# Patient Record
Sex: Male | Born: 1965
Health system: Southern US, Community
[De-identification: ages and names within clinical notes are randomized; demographics above are authoritative.]

## PROBLEM LIST (undated history)

## (undated) HISTORY — PX: ACHILLES TENDON REPAIR: SUR1153

## (undated) HISTORY — PX: KNEE ARTHROSCOPY: SUR90

---

## 1997-06-19 ENCOUNTER — Emergency Department (HOSPITAL_COMMUNITY): Admission: EM | Admit: 1997-06-19 | Discharge: 1997-06-19 | Payer: Self-pay | Admitting: Emergency Medicine

## 1999-04-15 ENCOUNTER — Ambulatory Visit (HOSPITAL_BASED_OUTPATIENT_CLINIC_OR_DEPARTMENT_OTHER): Admission: RE | Admit: 1999-04-15 | Discharge: 1999-04-16 | Payer: Self-pay | Admitting: Orthopedic Surgery

## 2000-05-08 ENCOUNTER — Ambulatory Visit (HOSPITAL_BASED_OUTPATIENT_CLINIC_OR_DEPARTMENT_OTHER): Admission: RE | Admit: 2000-05-08 | Discharge: 2000-05-09 | Payer: Self-pay | Admitting: Orthopedic Surgery

## 2002-03-25 ENCOUNTER — Ambulatory Visit (HOSPITAL_COMMUNITY): Admission: RE | Admit: 2002-03-25 | Discharge: 2002-03-25 | Payer: Self-pay | Admitting: Family Medicine

## 2002-03-25 ENCOUNTER — Encounter: Payer: Self-pay | Admitting: Family Medicine

## 2004-01-23 ENCOUNTER — Emergency Department (HOSPITAL_COMMUNITY): Admission: EM | Admit: 2004-01-23 | Discharge: 2004-01-23 | Payer: Self-pay | Admitting: Emergency Medicine

## 2004-08-26 ENCOUNTER — Emergency Department (HOSPITAL_COMMUNITY): Admission: EM | Admit: 2004-08-26 | Discharge: 2004-08-26 | Payer: Self-pay | Admitting: Emergency Medicine

## 2008-01-30 ENCOUNTER — Emergency Department (HOSPITAL_COMMUNITY): Admission: EM | Admit: 2008-01-30 | Discharge: 2008-01-30 | Payer: Self-pay | Admitting: Emergency Medicine

## 2008-03-03 ENCOUNTER — Emergency Department (HOSPITAL_COMMUNITY): Admission: EM | Admit: 2008-03-03 | Discharge: 2008-03-03 | Payer: Self-pay | Admitting: Emergency Medicine

## 2008-03-15 ENCOUNTER — Ambulatory Visit (HOSPITAL_COMMUNITY): Admission: RE | Admit: 2008-03-15 | Discharge: 2008-03-15 | Payer: Self-pay | Admitting: Neurosurgery

## 2008-04-10 ENCOUNTER — Ambulatory Visit (HOSPITAL_COMMUNITY): Admission: RE | Admit: 2008-04-10 | Discharge: 2008-04-11 | Payer: Self-pay | Admitting: Neurosurgery

## 2009-10-01 ENCOUNTER — Emergency Department (HOSPITAL_COMMUNITY): Admission: EM | Admit: 2009-10-01 | Discharge: 2009-10-01 | Payer: Self-pay | Admitting: Emergency Medicine

## 2009-12-07 ENCOUNTER — Encounter: Admission: RE | Admit: 2009-12-07 | Discharge: 2009-12-07 | Payer: Self-pay | Admitting: Orthopedic Surgery

## 2010-05-02 LAB — CBC
HCT: 45.6 % (ref 39.0–52.0)
Hemoglobin: 16 g/dL (ref 13.0–17.0)
MCHC: 35.1 g/dL (ref 30.0–36.0)
MCV: 93.9 fL (ref 78.0–100.0)
Platelets: 307 10*3/uL (ref 150–400)
RBC: 4.86 MIL/uL (ref 4.22–5.81)
RDW: 12.1 % (ref 11.5–15.5)
WBC: 6.2 10*3/uL (ref 4.0–10.5)

## 2010-05-02 LAB — BASIC METABOLIC PANEL
BUN: 11 mg/dL (ref 6–23)
CO2: 29 mEq/L (ref 19–32)
Calcium: 10.1 mg/dL (ref 8.4–10.5)
Chloride: 103 mEq/L (ref 96–112)
Creatinine, Ser: 0.92 mg/dL (ref 0.4–1.5)
GFR calc Af Amer: >60 mL/min (ref >60)
GFR calc non Af Amer: >60 mL/min (ref >60)
Glucose, Bld: 118 mg/dL — ABNORMAL HIGH (ref 70–99)
Potassium: 4.6 mEq/L (ref 3.5–5.1)
Sodium: 138 mEq/L (ref 135–145)

## 2010-06-04 NOTE — Op Note (Signed)
[name], [name]                 ACCOUNT NO.:  [id number]   MEDICAL RECORD NO.:  [id number]          PATIENT TYPE:  OIB   LOCATION:  3535                         FACILITY:  [facility]   PHYSICIAN:  Hewitt Shorts, M.D.DATE OF BIRTH:  06/28/1965   DATE OF PROCEDURE:  04/10/2008  DATE OF DISCHARGE:                               OPERATIVE REPORT   PREOPERATIVE DIAGNOSES:  1. Right L5-S1 lumbar disc herniation.  2. Lumbar degenerative disc disease.  3. Lumbar spondylosis.  4. Lumbar radiculopathy.   POSTOPERATIVE DIAGNOSES:  1. Right L5-S1 lumbar disc herniation.  2. Lumbar degenerative disc disease.  3. Lumbar spondylosis.  4. Lumbar radiculopathy.   PROCEDURE:  Right L5-S1 lumbar laminotomy and microdiskectomy with  microdissection.   SURGEON:  Hewitt Shorts, MD   ASSISTANT:  1. Nelia Shi. Webb Silversmith, RN  2. Payton Doughty, MD   ANESTHESIA:  General endotracheal.   INDICATIONS:  The patient is a 45 year old man who presented with right  lumbar radiculopathy.  He was found by MRI scan to have a large right L5-  S1 lumbar disc herniation superimposed upon underlying degenerative disk  disease and spondylosis.  Decision was made to proceed with elective  laminotomy and microdiscectomy.   PROCEDURE:  The patient was brought to the operating room and placed  under general endotracheal anesthesia.  The patient was turned to a  prone position.  Lumbar region was prepped with Betadine soap and  solution and draped in a sterile fashion.  The midline was infiltrated  with local anesthetic with epinephrine and then midline incision was  made over the L5-S1 level and x-ray was taken prior to incision to help  localize.  Dissection was carried down to the subcutaneous tissue to the  lumbar fascia, which was incised on the right side of the midline and  the paraspinal muscles were dissected from the spinous process and  lamina in a subperiosteal fashion.  The L5-S1 intralaminar space  was  identified and another x-ray was taken to confirm the localization and  then laminotomy was performed using the X'MaxDrill System.  The  microscope was draped and brought to the field to provide additional  magnification, illumination, and visualization.  The remainder of the  decompression was performed using microdissection and microsurgical  technique.  The edge of the laminotomy were waxed and then we carefully  removed the ligamentum flavum and identified the thecal sac and exiting  right S1 nerve root.  We began to dissect the nerve root, freeing it up  from surrounding epidural tissues.  The nerve root along with the thecal  sac was mobilized medially and the exposed disk herniation was quite  large and we were able to incise the remaining annular fibers and remove  significant fragment and additional small fragments were removed as  well.  Then, we entered into the disk space and we continued the  diskectomy by removing all loose fragments and disk material from both  the disk space and the epidural space and in the end, good decompression  of the thecal sac and nerve root was  achieved.  Bipolar cautery was used  to maintain hemostasis.  In the end, once diskectomy was completed and  good decompression was achieved, we irrigated the wound with Bacitracin  solution.  Hemostasis was established with bipolar cautery and then we  instilled 2 mL of fentanyl and 80 mg of Depo-Medrol into the epidural  space and proceeded with closure.  The deep fascia was closed with  interrupted undyed #1 Vicryl sutures.  The subcutaneous and subcuticular  were closed with interrupted inverted 2-0 and 3-0 undyed Vicryl sutures.  The skin was approximated with Dermabond.  The procedure was tolerated  well.  The estimated blood loss was less than 25 mL.  Sponge and needle  counts were correct.  Following surgery, the patient was returned back  to the supine position, to be reversed from the  anesthetic, extubated,  and transferred to the recovery room for further care.      Hewitt Shorts, M.D.  Electronically Signed     RWN/MEDQ  D:  04/10/2008  T:  04/11/2008  Job:  [id number]

## 2010-06-07 NOTE — Op Note (Signed)
[place]  Patient:    [name], [name]                          MRN: [id number] Proc. Date: 04/15/99 Adm. Date:  [id number] Disc. Date: [id number] Attending:  Milly Jakob CC:         Harvie Junior, M.D.                           Operative Report  PREOPERATIVE DIAGNOSIS:  Patellar tendon rupture.  POSTOPERATIVE DIAGNOSIS:  Patellar tendon rupture.  OPERATION PERFORMED:  Repair of right patellar tendon.  SURGEON:  Harvie Junior, M.D.  ASSISTANT:  Kerby Less, P.A.  ANESTHESIA:  General.  INDICATIONS FOR PROCEDURE:  The patient is a 45 year old male with a long history of having had a patellar tendon rupture about two weeks prior to evaluation. He felt a pop.  He was not able to move his leg, but he did not come to the doctor for two weeks and his initial evaluation was noted to have patella _______ on x-ray  and was noted to have inability to do a straight leg raise and a palpable gap at the junction of the patellar tendon with the bone.  At this point the patient was brought to the operating room for fixation this.  DESCRIPTION OF PROCEDURE:  The patient was brought to the operating room. After adequate anesthesia was obtained with general anesthetic, patient was placed supine on the operating table.  The right leg was prepped and draped in the usual sterile fashion.  Following Esmarch exsanguination of the extremity, a blood pressure tourniquet was inflated to 300 mmHg.  Following this, a midline incision was made over the anterior aspect of the patella.  The subcutaneous tissues were dissected down to the level of the patella and joint fluid was escaping into the wound at  this point.  The patellar tendon was identified and there was no connection of he patellar tendon to the patella.  Similarly the patellar retinaculum was torn both medially and laterally at fairly significant distance.  At this point the retinacular  tears were identified both medial and laterally.  The patellar tendon was identified.  There was a significant amount of scar tissue over the patellar tendon which had to be resected back to the level of the tendon.  The scar tissue over the inferior pole of the patella was identified and this was rongeured back to bleeding bone.  Four drill holes were then placed through the patella and parallel and following this three #5 Ethibond baseball sutures were passed through the patellar tendon.  These were advanced through the drill holes in the patella and tied with the knee in 45 degrees of flexion.  The patellar tendon was tied. There was no stress on the repair at that point.  Excellent fixation was achieved at his point with 45 degrees of flexion.  The three sutures were tied and following this, the knee joint was copiously irrigated and suctioned dry one final time.  The retinacular rents were closed with 0 Vicryl interrupted suture.  The deep layers were then closed with 0 Vicryl and 2-0 Vicryl and the skin with skin staples. he ____________ was closed prior to this closure.  The skin was then washed, dried  and a sterile compressive dressing was applied.  The patient was placed in a knee immobilizer.  He  will bee seen back in the office in about three or four days. DD:  04/15/99 TD:  04/16/99 Job: [id number]

## 2010-06-07 NOTE — Op Note (Signed)
[place]  Patient:    [name], [name]                          MRN: [id number] Proc. Date: 05/08/00 Adm. Date:  [id number] Attending:  Milly Jakob                           Operative Report  PREOPERATIVE DIAGNOSIS:  Patellar tendon rupture.  POSTOPERATIVE DIAGNOSIS:  Patellar tendon rupture.  PRINCIPAL PROCEDURE:  Repair of patellar tendon with medial side repaired to bone and lateral side repaired tendon-to-tendon.  SURGEON:  Harvie Junior, M.D.  ASSISTANT:  ______  ANESTHESIA:  General.  BRIEF HISTORY:  This is a 45 year old male with a long history of having a year ago a rupture of his patellar tendon that was repaired.  Ultimately, he went on to do well.  At this point, he had a similar injury and ruptured his patellar tendon.  He presented with inability to extend the knee.  We had a long talk about the treatment options and is also being taken to the operating room for repair of patellar tendon.  PROCEDURE:  The patient was taken to the operating room after adequate general anesthesia was obtained.  ______ The patient was placed on the operating table.  The left leg was then prepped and draped in the usual sterile fashion. Following Esmarch exsanguination of the extremity, the ______ was inflated to 300 mmHg.  Following this, a midline incision was made.  The subcutaneous tissue was taken down to the level of the patellar tendon.  The interesting findings at this point showed that the patellar tendon had ripped off of bone on the medial side.  On the lateral side, there were still fibers, which appeared to be stretched but went from the patella distally.  At this point, a prolonged period of time was undertaken trying to identify exactly where the fibers had come from.  It was a little bit unclear exactly the outline of the fiber pattern.  At this point, a central #5 Ethibond suture was used down the central portion of the tendon  in a baseball fashion.  A similar procedure was performed on the medial side of the tendon in the baseball fashion.  These were attached to bone with the knee at 45 degrees of flexion through drill holes after roughening up the bone surface at the inferior patellar pole. Following this, a #5 Ethibond suture was used in a [place]rakow fashion over this portion of patellar tendon which extended away from the kneecap on the lateral side, and a #5 Ethibond Krakow stitch was used on the lateral side of the patellar tendon on the inferior distal pole.  These two were tied in a Bunnell-type fashion, and then a retinacular closure was pulled over the patellar tendon.  The tendon could be flexed to 90 degrees without extensive tension on the tendinous repair.  At this point, the knee was copiously irrigated and suctioned dry.  The retinaculum was closed both medially and laterally with 0 Vicryl interrupted suture.  The deep layers were closed with 0 and 2-0 Vicryl and the skin with a Mersilene suture.  At this point, a sterile gauze dressing was applied.  The patient was taken to the recovery room in a knee immobilizer.  ESTIMATED BLOOD LOSS: None.  COMPLICATIONS: None. DD:  05/08/00 TD:  05/09/00 Job: [id number]

## 2011-03-07 ENCOUNTER — Emergency Department (HOSPITAL_COMMUNITY)
Admission: EM | Admit: 2011-03-07 | Discharge: 2011-03-07 | Disposition: A | Discharge status: Home / Self Care | Payer: Self-pay | Attending: Emergency Medicine | Admitting: Emergency Medicine

## 2011-03-07 ENCOUNTER — Encounter (HOSPITAL_COMMUNITY): Payer: Self-pay | Admitting: Emergency Medicine

## 2011-03-07 ENCOUNTER — Emergency Department (HOSPITAL_COMMUNITY): Payer: Self-pay

## 2011-03-07 DIAGNOSIS — S6390XA Sprain of unspecified part of unspecified wrist and hand, initial encounter: Secondary | ICD-10-CM | POA: Insufficient documentation

## 2011-03-07 DIAGNOSIS — M25649 Stiffness of unspecified hand, not elsewhere classified: Secondary | ICD-10-CM | POA: Insufficient documentation

## 2011-03-07 DIAGNOSIS — M7989 Other specified soft tissue disorders: Secondary | ICD-10-CM | POA: Insufficient documentation

## 2011-03-07 DIAGNOSIS — Y990 Civilian activity done for income or pay: Secondary | ICD-10-CM | POA: Insufficient documentation

## 2011-03-07 DIAGNOSIS — M79609 Pain in unspecified limb: Secondary | ICD-10-CM | POA: Insufficient documentation

## 2011-03-07 DIAGNOSIS — X500XXA Overexertion from strenuous movement or load, initial encounter: Secondary | ICD-10-CM | POA: Insufficient documentation

## 2011-03-07 DIAGNOSIS — S63619A Unspecified sprain of unspecified finger, initial encounter: Secondary | ICD-10-CM

## 2011-03-07 MED ORDER — NAPROXEN 500 MG PO TABS: 500.0000 mg | ORAL_TABLET | Freq: Two times a day (BID) | ORAL | Status: AC

## 2011-03-07 NOTE — ED Provider Notes (Signed)
History     CSN: [id number]  Arrival date & time 03/07/11  1111   First MD Initiated Contact with Patient 03/07/11 1220      Chief Complaint  Patient presents with  . Hand Pain    (Consider location/radiation/quality/duration/timing/severity/associated sxs/prior treatment) Patient is a 46 y.o. male presenting with hand pain. The history is provided by the patient (The patient states that he injured his left middle finger when he was at work he felt a pop in). No language interpreter was used.  Hand Pain This is a new problem. The current episode started more than 2 days ago. The problem occurs constantly. The problem has not changed since onset.Pertinent negatives include no chest pain, no abdominal pain and no headaches. The symptoms are aggravated by twisting. The symptoms are relieved by nothing. He has tried nothing for the symptoms. The treatment provided no relief.    History reviewed. No pertinent past medical history.  History reviewed. No pertinent past surgical history.  No family history on file.  History  Substance Use Topics  . Smoking status: Never Smoker   . Smokeless tobacco: Not on file  . Alcohol Use: Yes      Review of Systems  Constitutional: Negative for fatigue.  HENT: Negative for congestion, sinus pressure and ear discharge.   Eyes: Negative for discharge.  Respiratory: Negative for cough.   Cardiovascular: Negative for chest pain.  Gastrointestinal: Negative for abdominal pain and diarrhea.  Genitourinary: Negative for frequency and hematuria.  Musculoskeletal: Negative for back pain.  Skin: Negative for rash.  Neurological: Negative for seizures and headaches.  Hematological: Negative.   Psychiatric/Behavioral: Negative for hallucinations.    Allergies  Review of patient's allergies indicates no known allergies.  Home Medications   Current Outpatient Rx  Name Route Sig Dispense Refill  . NAPHAZOLINE-PHENIRAMINE 0.025-0.3 % OP SOLN  Both Eyes Place 1 drop into both eyes 4 (four) times daily as needed. For dry eyes    . NAPROXEN 500 MG PO TABS Oral Take 1 tablet (500 mg total) by mouth 2 (two) times daily. 30 tablet 0    BP 151/79  Pulse 67  Temp(Src) 98 F (36.7 C) (Oral)  Resp 14  SpO2 97%  Physical Exam  Constitutional: He is oriented to person, place, and time. He appears well-developed.  HENT:  Head: Normocephalic.  Eyes: Conjunctivae are normal.  Neck: No tracheal deviation present.  Cardiovascular:  No murmur heard. Musculoskeletal:       Swollen tender left middle finger patient is tender at the MCP and PIP joints.  Neurological: He is oriented to person, place, and time.  Skin: Skin is warm.  Psychiatric: He has a normal mood and affect.    ED Course  Procedures (including critical care time)  Labs Reviewed - No data to display Dg Finger Middle Left  03/07/2011  *RADIOLOGY REPORT*  Clinical Data: Pain, swelling and stiffness, injury, felt a pop  LEFT MIDDLE FINGER 2+V  Comparison: None  Findings: Bone mineralization normal. Joint spaces preserved. No fracture, dislocation, or bone destruction.  IMPRESSION: No acute osseous abnormalities.  Original Report Authenticated By: Lollie Marrow, M.D.     1. Finger sprain       MDM          Benny Lennert, MD 03/07/11 [phone]

## 2011-03-07 NOTE — ED Notes (Signed)
Hurt left middle finger at work 3 days ago now swollen states felt a pop when it happened

## 2012-09-24 ENCOUNTER — Emergency Department (HOSPITAL_COMMUNITY)
Admission: EM | Admit: 2012-09-24 | Discharge: 2012-09-24 | Disposition: A | Discharge status: Home / Self Care | Payer: Self-pay | Attending: Emergency Medicine | Admitting: Emergency Medicine

## 2012-09-24 DIAGNOSIS — Y9389 Activity, other specified: Secondary | ICD-10-CM | POA: Insufficient documentation

## 2012-09-24 DIAGNOSIS — Y92009 Unspecified place in unspecified non-institutional (private) residence as the place of occurrence of the external cause: Secondary | ICD-10-CM | POA: Insufficient documentation

## 2012-09-24 DIAGNOSIS — T6391XA Toxic effect of contact with unspecified venomous animal, accidental (unintentional), initial encounter: Secondary | ICD-10-CM | POA: Insufficient documentation

## 2012-09-24 DIAGNOSIS — T63461A Toxic effect of venom of wasps, accidental (unintentional), initial encounter: Secondary | ICD-10-CM | POA: Insufficient documentation

## 2012-09-24 MED ORDER — DIPHENHYDRAMINE HCL 25 MG PO TABS: 25.0000 mg | ORAL_TABLET | Freq: Four times a day (QID) | ORAL | Status: DC

## 2012-09-24 NOTE — ED Provider Notes (Signed)
CSN: [id number]     Arrival date & time 09/24/12  1046 History   First MD Initiated Contact with Patient 09/24/12 1142     No chief complaint on file.  (Consider location/radiation/quality/duration/timing/severity/associated sxs/prior Treatment) The history is provided by the patient.   47 year old male status post yellow jacket sting to the left for head yesterday. Today was significant swelling around his left eye patient denies lip swelling tongue swelling trouble breathing any hives or rash. Patient does not have a history of allergies to bee stings. Mild pain to left for head the 4/10. Denies any eye pain or visual changes.  No past medical history on file. No past surgical history on file. No family history on file. History  Substance Use Topics  . Smoking status: Never Smoker   . Smokeless tobacco: Not on file  . Alcohol Use: Yes    Review of Systems  Constitutional: Negative for fever.  HENT: Negative for sore throat, trouble swallowing and neck stiffness.   Eyes: Negative for pain and visual disturbance.  Respiratory: Negative for shortness of breath.   Gastrointestinal: Negative for nausea, vomiting and abdominal pain.  Skin: Negative for rash.  Neurological: Positive for headaches. Negative for speech difficulty.  Hematological: Does not bruise/bleed easily.  Psychiatric/Behavioral: Negative for decreased concentration.    Allergies  Review of patient's allergies indicates no known allergies.  Home Medications   Current Outpatient Rx  Name  Route  Sig  Dispense  Refill  . diphenhydrAMINE (BENADRYL) 25 MG tablet   Oral   Take 1 tablet (25 mg total) by mouth every 6 (six) hours.   20 tablet   0    BP 132/71  Pulse 72  Temp(Src) 98.2 F (36.8 C) (Oral)  Resp 22  SpO2 97% Physical Exam  Nursing note and vitals reviewed. Constitutional: He is oriented to person, place, and time. He appears well-developed and well-nourished. No distress.  HENT:  Head:  Normocephalic.  Mouth/Throat: Oropharynx is clear and moist.  Erythema and mild swelling to the left for head swelling around the upper eye lid and lower eyelid. No retained stinger.  Eyes: Conjunctivae and EOM are normal. Pupils are equal, round, and reactive to light.  Swelling of upper and lower lid sclerae clear. No pain with range of motion of the left eye. Vision is normal out of that eye.  Neck: Normal range of motion. Neck supple.  Cardiovascular: Normal rate.   No murmur heard. Pulmonary/Chest: Effort normal and breath sounds normal. No respiratory distress. He has no wheezes.  Abdominal: Soft. Bowel sounds are normal. There is no tenderness.  Musculoskeletal: Normal range of motion. He exhibits no edema.  Lymphadenopathy:    He has no cervical adenopathy.  Neurological: He is alert and oriented to person, place, and time. No cranial nerve deficit. He exhibits normal muscle tone. Coordination normal.  Skin: Skin is warm. No rash noted.    ED Course  Procedures (including critical care time) Labs Review Labs Reviewed - No data to display Imaging Review No results found.  MDM   1. Insect sting, initial encounter    Patient status post being from yellow jacket left for head area yesterday swelling around the eye left eye only consistent with a local reaction to the sting no evidence of allergic reaction. Patient has no lip swelling no tongue swelling or trouble breathing. Will treat with anti-inflammatory and Benadryl. Patient given reassurance.    Shelda Jakes, MD 09/24/12 1228

## 2012-09-24 NOTE — ED Notes (Signed)
Stung by yellow jacket yesterday evening  On head over left eye face is swollen no diff breathing or swollowing at this time drove himself here

## 2015-11-11 ENCOUNTER — Emergency Department (HOSPITAL_COMMUNITY)
Admission: EM | Admit: 2015-11-11 | Discharge: 2015-11-12 | Disposition: A | Discharge status: Home / Self Care | Payer: Self-pay | Attending: Emergency Medicine | Admitting: Emergency Medicine

## 2015-11-11 ENCOUNTER — Encounter (HOSPITAL_COMMUNITY): Payer: Self-pay

## 2015-11-11 ENCOUNTER — Emergency Department (HOSPITAL_COMMUNITY): Payer: Self-pay

## 2015-11-11 DIAGNOSIS — M25522 Pain in left elbow: Secondary | ICD-10-CM

## 2015-11-11 DIAGNOSIS — Y990 Civilian activity done for income or pay: Secondary | ICD-10-CM | POA: Insufficient documentation

## 2015-11-11 DIAGNOSIS — Y9389 Activity, other specified: Secondary | ICD-10-CM | POA: Insufficient documentation

## 2015-11-11 DIAGNOSIS — M25422 Effusion, left elbow: Secondary | ICD-10-CM

## 2015-11-11 DIAGNOSIS — Y9289 Other specified places as the place of occurrence of the external cause: Secondary | ICD-10-CM | POA: Insufficient documentation

## 2015-11-11 DIAGNOSIS — S46392A Other injury of muscle, fascia and tendon of triceps, left arm, initial encounter: Secondary | ICD-10-CM | POA: Insufficient documentation

## 2015-11-11 DIAGNOSIS — X501XXA Overexertion from prolonged static or awkward postures, initial encounter: Secondary | ICD-10-CM | POA: Insufficient documentation

## 2015-11-11 MED ORDER — OXYCODONE-ACETAMINOPHEN 5-325 MG PO TABS
ORAL_TABLET | ORAL | Status: AC
  Filled 2015-11-11: qty 1

## 2015-11-11 MED ORDER — OXYCODONE-ACETAMINOPHEN 5-325 MG PO TABS
1.0000 | ORAL_TABLET | ORAL | Status: DC | PRN
  Administered 2015-11-11: 1 via ORAL

## 2015-11-11 NOTE — ED Provider Notes (Signed)
[facility]-EMERGENCY DEPT Provider Note   CSN: [id number] Arrival date & time: 11/11/15  2015  By signing my name below, I, [name], attest that this documentation has been prepared under the direction and in the presence of [name], PA-C.  Electronically Signed: [name], ED Scribe. 11/12/15. 12:08 AM.  History   Chief Complaint Chief Complaint  Patient presents with  . Arm Pain   The history is provided by the patient. No language interpreter was used.   HPI Comments: [name] is a 50 y.o. male with no pertinent PMHx, who presents to the Emergency Department complaining of sudden onset, gradually worsening left elbow pain w/ associated joint swelling and ecchymosis to the area onset tonight PTA. He additionally states that his left thumb is subjective numb secondary to the onset of his elbow pain. 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 Pt was given a dosage of Oxycodone while in the ED with moderate relief of his pain. His pain to the area is exacerbated with full extension of the elbow. Denies tingling, or any other associated symptoms.   History reviewed. No pertinent past medical history.  There are no active problems to display for this patient.  History reviewed. No pertinent surgical history.  Home Medications    Prior to Admission medications   Medication Sig Start Date End Date Taking? Authorizing Provider  diphenhydrAMINE (BENADRYL) 25 MG tablet Take 1 tablet (25 mg total) by mouth every 6 (six) hours. 09/24/12   Vanetta Mulders, MD  HYDROcodone-acetaminophen (NORCO/VICODIN) 5-325 MG tablet Take 1-2 tablets by mouth every 6 (six) hours as needed. 11/12/15   Jerre Simon, PA   Family History History reviewed. No pertinent family history.  Social  History Social History  Substance Use Topics  . Smoking status: Never Smoker  . Smokeless tobacco: Never Used  . Alcohol use Yes   Allergies   Review of patient's allergies indicates no known allergies.  Review of Systems Review of Systems  Musculoskeletal: Positive for arthralgias (left elbow), joint swelling (left elbow) and myalgias.  Skin: Positive for color change. Negative for wound.  Neurological: Positive for numbness (left thumb). Negative for weakness.  All other systems reviewed and are negative.  Physical Exam Updated Vital Signs BP 158/92 (BP Location: Right Arm)   Pulse 78   Temp 98.5 F (36.9 C) (Oral)   Resp 18   Ht 5' 7.75" (1.721 m)   Wt 192 lb (87.1 kg)   SpO2 100%   BMI 29.41 kg/m   Physical Exam  Constitutional: He appears well-developed and well-nourished. No distress.  HENT:  Head: Normocephalic and atraumatic.  Eyes: Conjunctivae are normal.  Cardiovascular:  Pulses:      Radial pulses are 2+ on the right side, and 2+ on the left side.  Pulmonary/Chest: Effort normal. No respiratory distress.  Musculoskeletal:  Significant edema noted to superior aspect posterior elbow. Palpable indentation noted to tricep tendon region. No visual deformity to tricep. Tenderness to palpation of the tricep region. Sensation intact. Neurovascularly intact distally.   Neurological: He is alert. Coordination normal.  Skin: Skin is warm and dry. He is not diaphoretic.  Psychiatric: He has a normal mood and affect. His behavior is normal.  Nursing note and vitals reviewed.  ED Treatments / Results  DIAGNOSTIC STUDIES: Oxygen Saturation is 100% on RA, normal by my interpretation.   COORDINATION OF CARE: 12:08 AM-Discussed next steps with pt. Pt verbalized understanding and is agreeable with the plan.   Labs (all labs ordered are listed, but only abnormal results are displayed) Labs Reviewed - No data to display  EKG  EKG Interpretation None       Radiology Dg Elbow Complete Left  Result Date: 11/11/2015 CLINICAL DATA:  Left elbow pain.  Injury today.  Initial encounter. EXAM: LEFT ELBOW - COMPLETE 3+ VIEW COMPARISON:  None. FINDINGS: There is no evidence of fracture, dislocation, or joint effusion. There is no evidence of arthropathy or other focal bone abnormality. Soft tissues are unremarkable. IMPRESSION: Negative. Electronically Signed   By: Sebastian AcheAllen  Grady M.D.   On: 11/11/2015 22:37   Procedures Procedures   Medications Ordered in ED Medications  oxyCODONE-acetaminophen (PERCOCET/ROXICET) 5-325 MG per tablet 1 tablet (1 tablet Oral Given 11/11/15 2028)  oxyCODONE-acetaminophen (PERCOCET/ROXICET) 5-325 MG per tablet (not administered)   Initial Impression / Assessment and Plan / ED Course  I have reviewed the triage vital signs and the nursing notes.  Pertinent labs & imaging results that were available during my care of the patient were reviewed by me and considered in my medical decision making (see chart for details).  Clinical Course   Patient X-Ray Reviewed by me negative for obvious fracture or dislocation. Pain managed in ED. Pt advised to follow up with orthopedics for possible Tricep tendon or muscle rupture diagnosis. Patient given sling while in ED, conservative therapy recommended and discussed. Patient will be dc home & is agreeable with above plan. Pt is comfortable with above plan and is stable for discharge at this time. Instructed patient to call Dr. Aundria Rudogers, orthopedics tomorrow morning to be seen this week. All questions were answered prior to disposition. Strict return precautions for f/u into the ED were discussed.   Patient case discussed and patient seen by Dr. Preston FleetingGlick who agrees with the above plan.  Final Clinical Impressions(s) / ED Diagnoses   Final diagnoses:  Left elbow pain  Elbow swelling, left  Other injury of muscle, fascia and tendon of triceps, left arm, initial encounter    New  Prescriptions Discharge Medication List as of 11/12/2015 12:44 AM    START taking these medications   Details  HYDROcodone-acetaminophen (NORCO/VICODIN) 5-325 MG tablet Take 1-2 tablets by mouth every 6 (six) hours as needed., Starting Mon 11/12/2015, Print       I personally performed the services described in this documentation, which was scribed in my presence. The recorded information has been reviewed and is accurate.       Jerre SimonJessica L Marlynn Hinckley, PA 11/12/15 [id number]    Dione Boozeavid Glick, MD 11/12/15 [phone]

## 2015-11-11 NOTE — ED Triage Notes (Signed)
Pt complaining of L elbow pain. 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 Attempting to remove individual when elbow "got twisted." Pt + sensation, <3s cap refill. Limited ROM.

## 2015-11-12 MED ORDER — HYDROCODONE-ACETAMINOPHEN 5-325 MG PO TABS: 1.0000 | ORAL_TABLET | Freq: Four times a day (QID) | ORAL | 0 refills | Status: DC | PRN

## 2015-11-12 NOTE — Discharge Instructions (Signed)
Ice your elbow as often as you can, 20 minutes on, 20 minutes off. Take the Norco as needed for pain. Take a stool softener while taking the Norco to prevent constipation. Call Dr. William Hamburgeroger's office, orthopedics, tomorrow morning to schedule an appointment to be seen this week. Wear the sling at all times for comfort except to shower.   Return immediately to the emergency department if you experience numbness/tingling, weakness in your left arm, increased pain, increased swelling or any other concerning symptoms.

## 2018-03-12 ENCOUNTER — Encounter (HOSPITAL_COMMUNITY): Payer: Self-pay

## 2018-03-12 ENCOUNTER — Other Ambulatory Visit: Payer: Self-pay

## 2018-03-12 ENCOUNTER — Emergency Department (HOSPITAL_COMMUNITY): Payer: Self-pay

## 2018-03-12 ENCOUNTER — Emergency Department (HOSPITAL_COMMUNITY)
Admission: EM | Admit: 2018-03-12 | Discharge: 2018-03-12 | Disposition: A | Discharge status: Home / Self Care | Payer: Self-pay | Attending: Emergency Medicine | Admitting: Emergency Medicine

## 2018-03-12 DIAGNOSIS — R030 Elevated blood-pressure reading, without diagnosis of hypertension: Secondary | ICD-10-CM | POA: Insufficient documentation

## 2018-03-12 DIAGNOSIS — R739 Hyperglycemia, unspecified: Secondary | ICD-10-CM | POA: Insufficient documentation

## 2018-03-12 LAB — BASIC METABOLIC PANEL
Anion gap: 12 (ref 5–15)
BUN: 13 mg/dL (ref 6–20)
CALCIUM: 10 mg/dL (ref 8.9–10.3)
CO2: 22 mmol/L (ref 22–32)
Chloride: 99 mmol/L (ref 98–111)
Creatinine, Ser: 1.08 mg/dL (ref 0.61–1.24)
GFR calc Af Amer: >60 mL/min (ref >60)
GFR calc non Af Amer: >60 mL/min (ref >60)
Glucose, Bld: 323 mg/dL — ABNORMAL HIGH (ref 70–99)
POTASSIUM: 4.2 mmol/L (ref 3.5–5.1)
SODIUM: 133 mmol/L — AB (ref 135–145)

## 2018-03-12 LAB — CBC
HCT: 47.7 % (ref 39.0–52.0)
HEMOGLOBIN: 16.4 g/dL (ref 13.0–17.0)
MCH: 30.9 pg (ref 26.0–34.0)
MCHC: 34.4 g/dL (ref 30.0–36.0)
MCV: 90 fL (ref 80.0–100.0)
Platelets: 331 10*3/uL (ref 150–400)
RBC: 5.3 MIL/uL (ref 4.22–5.81)
RDW: 11.4 % — ABNORMAL LOW (ref 11.5–15.5)
WBC: 9.8 10*3/uL (ref 4.0–10.5)
nRBC: 0 % (ref 0.0–0.2)

## 2018-03-12 LAB — URINALYSIS, ROUTINE W REFLEX MICROSCOPIC
BACTERIA UA: NONE SEEN
Bilirubin Urine: NEGATIVE
HGB URINE DIPSTICK: NEGATIVE
Ketones, ur: 20 mg/dL — AB
Leukocytes,Ua: NEGATIVE
NITRITE: NEGATIVE
PROTEIN: NEGATIVE mg/dL
Specific Gravity, Urine: 1.035 — ABNORMAL HIGH (ref 1.005–1.030)
pH: 5 (ref 5.0–8.0)

## 2018-03-12 LAB — I-STAT TROPONIN, ED: Troponin i, poc: 0 ng/mL (ref 0.00–0.08)

## 2018-03-12 LAB — CBG MONITORING, ED: GLUCOSE-CAPILLARY: 277 mg/dL — AB (ref 70–99)

## 2018-03-12 MED ORDER — LISINOPRIL 5 MG PO TABS: 5.0000 mg | ORAL_TABLET | Freq: Every day | ORAL | 0 refills | Status: DC

## 2018-03-12 NOTE — ED Triage Notes (Addendum)
Pt from home c/o HTN; states his wife checked his pressure this am and he was 190's systolic; pt endorses dizziness and "not feeling well" x 1 week; denies CP, SOB, vision changes; pt states he has not had much of an appetite lately; Denies N/V/D

## 2018-03-12 NOTE — ED Notes (Signed)
Patient verbalizes understanding of discharge instructions. Opportunity for questioning and answers were provided. Pt discharged from ED. 

## 2018-03-12 NOTE — ED Notes (Signed)
EDP advised do not need I-Stat trop.

## 2018-03-12 NOTE — ED Provider Notes (Signed)
[facility] EMERGENCY DEPARTMENT Provider Note   CSN: [id number] Arrival date & time: 03/12/18  1141    History   Chief Complaint Chief Complaint  Patient presents with  . Hypertension    HPI [name] is a 53 y.o. male with history of stomach ulcers presenting to emergency department with chief complaint of hypertension x1 week.  Patient reports he felt symptomatic so he checked his blood pressure and it was 140/109.  He was also having generalized abdominal pain that he describes as an ache, nausea and diaphoresis.  His symptoms resolved in minutes without intervention.  Patient also reports chest pain x3 days ago.  The pain started when he was sitting on the couch.  The pain is located on the right side and he describes it as a soreness.  It did not radiate. the pain was present for 30 minutes so the patient took Aleve and had immediate relief.  History of stomach ulcers that he was diagnosed with in his 29s.  He used to take Zantac for symptom relief.  He has not taken any medication in several months.   Also admits to daily marijuana use but reports he has stopped 1 month ago.  Smokes marijuana stopped 1 month ago.   Pt denies fever, chills, polyuria, polydipsia, blurry vision.     History reviewed. No pertinent past medical history.  There are no active problems to display for this patient.   History reviewed. No pertinent surgical history.      Home Medications    Prior to Admission medications   Medication Sig Start Date End Date Taking? Authorizing Provider  lisinopril (PRINIVIL,ZESTRIL) 5 MG tablet Take 1 tablet (5 mg total) by mouth daily. 03/12/18   Toron Bowring, Caroleen Hamman, PA-C    Family History No family history on file.  Social History Social History   Tobacco Use  . Smoking status: Never Smoker  . Smokeless tobacco: Never Used  Substance Use Topics  . Alcohol use: Not Currently  . Drug use: Not Currently     Allergies   Patient  has no known allergies.   Review of Systems Review of Systems  Cardiovascular: Positive for chest pain.  Genitourinary: Negative for dysuria, frequency and hematuria.  Neurological: Positive for dizziness.     Physical Exam Updated Vital Signs BP (!) 169/99   Pulse 89   Temp 98.1 F (36.7 C)   Resp 14   Ht 5\' 7"  (1.702 m)   Wt 83.9 kg   SpO2 97%   BMI 28.98 kg/m   Physical Exam Vitals signs and nursing note reviewed.  Constitutional:      Appearance: He is not ill-appearing or toxic-appearing.  HENT:     Head: Normocephalic and atraumatic.     Nose: Nose normal.     Mouth/Throat:     Mouth: Mucous membranes are moist.     Pharynx: Oropharynx is clear.  Eyes:     General: No scleral icterus.       Right eye: No discharge.        Left eye: No discharge.     Extraocular Movements: Extraocular movements intact.     Conjunctiva/sclera: Conjunctivae normal.     Pupils: Pupils are equal, round, and reactive to light.  Neck:     Musculoskeletal: Normal range of motion. No muscular tenderness.  Cardiovascular:     Rate and Rhythm: Normal rate and regular rhythm.     Pulses: Normal pulses.  Radial pulses are 2+ on the right side and 2+ on the left side.       Dorsalis pedis pulses are 2+ on the right side and 2+ on the left side.     Heart sounds: Normal heart sounds, S1 normal and S2 normal. No murmur.  Pulmonary:     Effort: Pulmonary effort is normal.     Breath sounds: Normal breath sounds.  Abdominal:     General: There is no distension.     Palpations: Abdomen is soft.     Tenderness: There is no abdominal tenderness. There is no right CVA tenderness, left CVA tenderness, guarding or rebound.  Musculoskeletal: Normal range of motion.     Right lower leg: No edema.     Left lower leg: No edema.  Skin:    General: Skin is warm and dry.     Capillary Refill: Capillary refill takes less than 2 seconds.  Neurological:     Mental Status: He is alert.  Mental status is at baseline.     Motor: No weakness.  Psychiatric:        Behavior: Behavior normal.      ED Treatments / Results  Labs (all labs ordered are listed, but only abnormal results are displayed) Labs Reviewed  BASIC METABOLIC PANEL - Abnormal; Notable for the following components:      Result Value   Sodium 133 (*)    Glucose, Bld 323 (*)    All other components within normal limits  CBC - Abnormal; Notable for the following components:   RDW 11.4 (*)    All other components within normal limits  URINALYSIS, ROUTINE W REFLEX MICROSCOPIC - Abnormal; Notable for the following components:   Specific Gravity, Urine 1.035 (*)    Glucose, UA >=500 (*)    Ketones, ur 20 (*)    All other components within normal limits  CBG MONITORING, ED - Abnormal; Notable for the following components:   Glucose-Capillary 277 (*)    All other components within normal limits  I-STAT TROPONIN, ED    EKG None  Radiology Dg Chest 2 View  Result Date: 03/12/2018 CLINICAL DATA:  Chest pain.  Hypertension. EXAM: CHEST - 2 VIEW COMPARISON:  None. FINDINGS: The heart size and mediastinal contours are within normal limits. Both lungs are clear. The visualized skeletal structures are unremarkable. IMPRESSION: No active cardiopulmonary disease. Electronically Signed   By: Francene Boyers M.D.   On: 03/12/2018 12:52    Procedures Procedures (including critical care time)  Medications Ordered in ED Medications - No data to display   Initial Impression / Assessment and Plan / ED Course  I have reviewed the triage vital signs and the nursing notes.  Pertinent labs & imaging results that were available during my care of the patient were reviewed by me and considered in my medical decision making (see chart for details).   Pt's blood pressure is elevated today.  He is well-appearing, in no acute distress.Patient noted to be hypertensive in the emergency department.  No signs of hypertensive  urgency. He has had multiple elevated blood pressure readings over the last week. I viewed his chest xray today and do not see evidence of cardiopulmonary disease. His EKG is without ischemic changes. His troponin is negative. BMP shows glucose of 323 without an elevated anion gap, not suggestive of DKA. CBC unremarkable. UA was pending at discharge but patient did not want to stay and wait for results.  I encouraged him  to wait however he said he had to leave. UA shows glucose >500, 20 ketones.  Patient is asymptomatic at this time. Will start on Lisinopril 5mg  PO daily at discharge and discussed with patient the need for close follow-up and management by a primary care physician for blood sugar control. He does not have known diabetes diagnosis, but his labs today are suggestive of diabetes. Pt tolerated PO intake of water and sandwich while in ED. He reports his dizziness has improved and he continues to deny chest pain. Discussed lifestyle changes the patient can start including Dash diet, increased physical activity. Pt does not have pcp. [facility] included in discharge papers. I discussed at length the risks of untreated diabetes and hypertension.  Discussed strict ED return precautions. Pt verbalized understanding of and is in agreement with this plan. Pt stable for discharge home at this time. Pt case discussed with Dr. Donnald GarrePfeiffer who agrees with my plan.   Vitals:   03/12/18 1315 03/12/18 1400  BP: (!) 169/99 (!) 169/99  Pulse: 82 89  Resp: 16 14  Temp:    SpO2: 95% 97%         Final Clinical Impressions(s) / ED Diagnoses   Final diagnoses:  Elevated blood pressure reading  Elevated blood sugar    ED Discharge Orders         Ordered    lisinopril (PRINIVIL,ZESTRIL) 5 MG tablet  Daily     03/12/18 [address]1425           Idamay Hosein, Mliss SaxKaitlyn E, PA-C 03/17/18 2223    Arby BarrettePfeiffer, Marcy, MD 03/19/18 1734

## 2018-03-12 NOTE — Discharge Instructions (Signed)
You have been seen today for high blood pressure. Please read and follow all provided instructions. Return to the emergency room for worsening condition or new concerning symptoms.    1. Medications:  Prescription sent to your pharmacy for lisinopril.  This is a medication for high blood pressure.  Please take as prescribed.   Continue usual home medications Take medications as prescribed. Please review all of the medicines and only take them if you do not have an allergy to them.  2. Treatment: rest, drink plenty of fluids 3. Follow Up: Please call [facility] today.  You need to schedule follow-up appointment for your hypertension and elevated blood sugar.  It is very important that you follow-up.  It is also a possibility that you have an allergic reaction to any of the medicines that you have been prescribed - Everybody reacts differently to medications and while MOST people have no trouble with most medicines, you may have a reaction such as nausea, vomiting, rash, swelling, shortness of breath. If this is the case, please stop taking the medicine immediately and contact your physician.  ?

## 2018-03-23 NOTE — Progress Notes (Signed)
Patient ID: [name], male   DOB: 07-10-1965, 53 y.o.   MRN: [id number]       [name], is a 53 y.o. male  CVE:[id number]  WCH:[id number]  DOB - [birth year]  Subjective:  Chief Complaint and HPI: [name] is a 53 y.o. male here today to establish care and for a follow up visit After being seen in the ED 03/12/2018 for htn.  His blood sugar was also found to be >300.  He did not know he was diabetic.  No meds were started for this.  He denies polydipsia/polyuria.  No vision changes.    From ED note: [name] is a 53 y.o. male with history of stomach ulcers presenting to emergency department with chief complaint of hypertension x1 week.  Patient reports he felt symptomatic so he checked his blood pressure and it was 140/109.  He was also having generalized abdominal pain that he describes as an ache, nausea and diaphoresis.  His symptoms resolved in minutes without intervention.  Patient also reports chest pain x3 days ago.  The pain started when he was sitting on the couch.  The pain is located on the right side and he describes it as a soreness.  It did not radiate. the pain was present for 30 minutes so the patient took Aleve and had immediate relief.  History of stomach ulcers that he was diagnosed with in his 17s.  He used to take Zantac for symptom relief.  He has not taken any medication in several months.  From A/P: Pt's blood pressure is elevated today.  He is well-appearing, in no acute distress.Patient noted to be hypertensive in the emergency department.  No signs of hypertensive urgency. He has had multiple elevated blood pressure readings over the last week. I viewed his chest xray today and do not see evidence of cardiopulmonary disease. His EKG is without ischemic changes. His troponin is negative. BMP shows glucose of 323 without an elevated anion gap, not suggestive of DKA. CBC unremarkable. UA was pending at discharge but patient did not want to stay and wait for  results.  I encouraged him to wait however he said he had to leave. UA shows glucose >500, 20 ketones.  Patient is asymptomatic at this time. Will start on Lisinopril '5mg'$  PO daily at discharge and discussed with patient the need for close follow-up and management by a primary care physician for blood sugar control. He does not have known diabetes diagnosis, but his labs today are suggestive of diabetes. Pt tolerated PO intake of water and sandwich while in ED. He reports his dizziness has improved and he continues to deny chest pain. Discussed lifestyle changes the patient can start including Dash diet, increased physical activity. Pt does not have pcp. Information for [facility] included in discharge papers. I discussed at length the risks of untreated diabetes and hypertension.  Discussed strict ED return precautions. Pt verbalized understanding of and is in agreement with this plan. Pt stable for discharge home at this time. Pt case discussed with Dr. Johnney Killian who agrees with my plan.   ED/Hospital notes reviewed.   Social History:  Works out regularly, no insurance Family history:  Mom and aunts with DM.  +htn in family too  ROS:   Constitutional:  No f/c, No night sweats, No unexplained weight loss. EENT:  No vision changes, No blurry vision, No hearing changes. No mouth, throat, or ear problems.  Respiratory: No cough, No SOB  Cardiac: No CP, no palpitations GI:  No abd pain, No N/V/D. GU: No Urinary s/sx Musculoskeletal: No joint pain Neuro: No headache, no dizziness, no motor weakness.  Skin: No rash Endocrine:  No polydipsia. No polyuria.  Psych: Denies SI/HI  No problems updated.  ALLERGIES: No Known Allergies  PAST MEDICAL HISTORY: No past medical history on file.  MEDICATIONS AT HOME: Prior to Admission medications   Medication Sig Start Date End Date Taking? Authorizing Provider  Blood Glucose Monitoring Suppl (TRUE METRIX METER)  w/Device KIT Check blood sugars fasting and bedtime 03/24/18   Freeman Caldron M, PA-C  glucose blood (TRUE METRIX BLOOD GLUCOSE TEST) test strip Use as instructed 03/24/18   Argentina Donovan, PA-C  lisinopril (PRINIVIL,ZESTRIL) 10 MG tablet Take 1 tablet (10 mg total) by mouth daily. 03/24/18   Argentina Donovan, PA-C  metFORMIN (GLUCOPHAGE) 1000 MG tablet Take 1 tablet (1,000 mg total) by mouth 2 (two) times daily with a meal. 03/24/18   McClung, Dionne Bucy, PA-C  TRUEPLUS LANCETS 28G MISC Check blood sugars fasting and bedtime 03/24/18   Argentina Donovan, PA-C     Objective:  EXAM:   Vitals:   03/24/18 0936  BP: (!) 152/93  Pulse: 95  Resp: 16  Temp: 98.4 F (36.9 C)  TempSrc: Oral  SpO2: 97%  Weight: 184 lb 3.2 oz (83.6 kg)  Height: _0  (1.702 m)    General appearance : A&OX3. NAD. Non-toxic-appearing HEENT: Atraumatic and Normocephalic.  PERRLA. EOM intact.  Neck: supple, no JVD. No cervical lymphadenopathy. No thyromegaly Chest/Lungs:  Breathing-non-labored, Good air entry bilaterally, breath sounds normal without rales, rhonchi, or wheezing  CVS: S1 S2 regular, no murmurs, gallops, rubs  Extremities: Bilateral Lower Ext shows no edema, both legs are warm to touch with = pulse throughout Neurology:  CN II-XII grossly intact, Non focal.   Psych:  TP linear. J/I WNL. Normal speech. Appropriate eye contact and affect.  Skin:  No Rash  Data Review Lab Results  Component Value Date   HGBA1C 11.7 (A) 03/24/2018     Assessment & Plan   1. Type 2 diabetes mellitus with hyperglycemia, without long-term current use of insulin ([place]) New diagnosis.  Check blood sugars fasting daily and at bedtime at least 3 times weekly.    Metformin-take 1/2 table twice daily with food for 1 week then increase dose to 1 whole tablet twice daily with food. - HgB A1c - POCT glucose (manual entry) - insulin aspart (novoLOG) injection 15 Units - Microalbumin / creatinine urine ratio - metFORMIN  (GLUCOPHAGE) 1000 MG tablet; Take 1 tablet (1,000 mg total) by mouth 2 (two) times daily with a meal.  Dispense: 180 tablet; Refill: 3 - Blood Glucose Monitoring Suppl (TRUE METRIX METER) w/Device KIT; Check blood sugars fasting and bedtime  Dispense: 1 kit; Refill: 0 - glucose blood (TRUE METRIX BLOOD GLUCOSE TEST) test strip; Use as instructed  Dispense: 100 each; Refill: 12 - TRUEPLUS LANCETS 28G MISC; Check blood sugars fasting and bedtime  Dispense: 100 each; Refill: 1  2. Hypertension, unspecified type Uncontrolled. Increase dose lisinopril - lisinopril (PRINIVIL,ZESTRIL) 10 MG tablet; Take 1 tablet (10 mg total) by mouth daily.  Dispense: 90 tablet; Refill: 3 Record and bring to next visit.  Check blood pressure 3 times a week and record and bring to next visit.   3. Hospital discharge follow-up  Patient have been counseled extensively about nutrition and exercise-spent >67mns face to face counseling on diabetic diet/answering questions.  Return in about 1 month (around 04/24/2018) for assign PCP-f/up htn and diabetes.  The patient was given clear instructions to go to ER or return to medical center if symptoms don't improve, worsen or new problems develop. The patient verbalized understanding. The patient was told to call to get lab results if they haven't heard anything in the next week.     Freeman Caldron, PA-C [facility] [place], [place]   03/24/2018, 10:08 AM

## 2018-03-24 ENCOUNTER — Ambulatory Visit: Payer: Self-pay | Attending: Family Medicine | Admitting: Physician Assistant

## 2018-03-24 ENCOUNTER — Ambulatory Visit: Payer: Self-pay | Attending: Family Medicine | Admitting: Pharmacist

## 2018-03-24 VITALS — BP 152/93 | HR 95 | Temp 98.4°F | Resp 16 | Ht 67.0 in | Wt 184.2 lb

## 2018-03-24 DIAGNOSIS — R739 Hyperglycemia, unspecified: Secondary | ICD-10-CM

## 2018-03-24 DIAGNOSIS — Z79899 Other long term (current) drug therapy: Secondary | ICD-10-CM

## 2018-03-24 DIAGNOSIS — Z09 Encounter for follow-up examination after completed treatment for conditions other than malignant neoplasm: Secondary | ICD-10-CM

## 2018-03-24 DIAGNOSIS — E1165 Type 2 diabetes mellitus with hyperglycemia: Secondary | ICD-10-CM

## 2018-03-24 DIAGNOSIS — I10 Essential (primary) hypertension: Secondary | ICD-10-CM

## 2018-03-24 LAB — POCT GLYCOSYLATED HEMOGLOBIN (HGB A1C): HbA1c, POC (controlled diabetic range): 11.7 % — AB (ref 0.0–7.0)

## 2018-03-24 LAB — GLUCOSE, POCT (MANUAL RESULT ENTRY)
POC Glucose: 286 mg/dl — AB (ref 70–99)
POC Glucose: 331 mg/dl — AB (ref 70–99)

## 2018-03-24 MED ORDER — GLUCOSE BLOOD VI STRP: ORAL_STRIP | 12 refills | Status: AC

## 2018-03-24 MED ORDER — INSULIN ASPART 100 UNIT/ML ~~LOC~~ SOLN
15.0000 [IU] | Freq: Once | SUBCUTANEOUS | Status: AC
  Administered 2018-03-24: 15 [IU] via SUBCUTANEOUS

## 2018-03-24 MED ORDER — LISINOPRIL 10 MG PO TABS: 10.0000 mg | ORAL_TABLET | Freq: Every day | ORAL | 3 refills | Status: DC

## 2018-03-24 MED ORDER — METFORMIN HCL 1000 MG PO TABS: 1000.0000 mg | ORAL_TABLET | Freq: Two times a day (BID) | ORAL | 3 refills | Status: AC

## 2018-03-24 MED ORDER — TRUEPLUS LANCETS 28G MISC: 1 refills | Status: AC

## 2018-03-24 MED ORDER — TRUE METRIX METER W/DEVICE KIT: PACK | 0 refills | Status: AC

## 2018-03-24 MED FILL — metFORMIN HCL 1000 MG TABS: 1000 | 30 days supply | Qty: 60 | Fill #0

## 2018-03-24 MED FILL — LISINOPRIL 10 MG TABS: 10 | 30 days supply | Qty: 30 | Fill #0

## 2018-03-24 MED FILL — TRUE METRIX TEST STRIP: 25 days supply | Qty: 100 | Fill #0

## 2018-03-24 MED FILL — !TRUE METRIX BLOOD GLUCOSE: 1 days supply | Qty: 1 | Fill #0

## 2018-03-24 MED FILL — TRUEplus LANCETS 28G MISC: 25 days supply | Qty: 100 | Fill #0

## 2018-03-24 NOTE — Patient Instructions (Addendum)
Check blood sugars fasting daily and at bedtime at least 3 times weekly.  Record and bring to next visit.  Check blood pressure 3 times a week and record and bring to next visit.    Metformin-take 1/2 table twice daily with food for 1 week then increase dose to 1 whole tablet twice daily with food.       Diabetes Mellitus and Exercise Exercising regularly is important for your overall health, especially when you have diabetes (diabetes mellitus). Exercising is not only about losing weight. It has many other health benefits, such as increasing muscle strength and bone density and reducing body fat and stress. This leads to improved fitness, flexibility, and endurance, all of which result in better overall health. Exercise has additional benefits for people with diabetes, including:  Reducing appetite.  Helping to lower and control blood glucose.  Lowering blood pressure.  Helping to control amounts of fatty substances (lipids) in the blood, such as cholesterol and triglycerides.  Helping the body to respond better to insulin (improving insulin sensitivity).  Reducing how much insulin the body needs.  Decreasing the risk for heart disease by: ? Lowering cholesterol and triglyceride levels. ? Increasing the levels of good cholesterol. ? Lowering blood glucose levels. What is my activity plan? Your health care provider or certified diabetes educator can help you make a plan for the type and frequency of exercise (activity plan) that works for you. Make sure that you:  Do at least 150 minutes of moderate-intensity or vigorous-intensity exercise each week. This could be brisk walking, biking, or water aerobics. ? Do stretching and strength exercises, such as yoga or weightlifting, at least 2 times a week. ? Spread out your activity over at least 3 days of the week.  Get some form of physical activity every day. ? Do not go more than 2 days in a row without some kind of physical  activity. ? Avoid being inactive for more than 30 minutes at a time. Take frequent breaks to walk or stretch.  Choose a type of exercise or activity that you enjoy, and set realistic goals.  Start slowly, and gradually increase the intensity of your exercise over time. What do I need to know about managing my diabetes?   Check your blood glucose before and after exercising. ? If your blood glucose is 240 mg/dL (16.1 mmol/L) or higher before you exercise, check your urine for ketones. If you have ketones in your urine, do not exercise until your blood glucose returns to normal. ? If your blood glucose is 100 mg/dL (5.6 mmol/L) or lower, eat a snack containing 15-20 grams of carbohydrate. Check your blood glucose 15 minutes after the snack to make sure that your level is above 100 mg/dL (5.6 mmol/L) before you start your exercise.  Know the symptoms of low blood glucose (hypoglycemia) and how to treat it. Your risk for hypoglycemia increases during and after exercise. Common symptoms of hypoglycemia can include: ? Hunger. ? Anxiety. ? Sweating and feeling clammy. ? Confusion. ? Dizziness or feeling light-headed. ? Increased heart rate or palpitations. ? Blurry vision. ? Tingling or numbness around the mouth, lips, or tongue. ? Tremors or shakes. ? Irritability.  Keep a rapid-acting carbohydrate snack available before, during, and after exercise to help prevent or treat hypoglycemia.  Avoid injecting insulin into areas of the body that are going to be exercised. For example, avoid injecting insulin into: ? The arms, when playing tennis. ? The legs, when jogging.  Keep records of your exercise habits. Doing this can help you and your health care provider adjust your diabetes management plan as needed. Write down: ? Food that you eat before and after you exercise. ? Blood glucose levels before and after you exercise. ? The type and amount of exercise you have done. ? When your insulin  is expected to peak, if you use insulin. Avoid exercising at times when your insulin is peaking.  When you start a new exercise or activity, work with your health care provider to make sure the activity is safe for you, and to adjust your insulin, medicines, or food intake as needed.  Drink plenty of water while you exercise to prevent dehydration or heat stroke. Drink enough fluid to keep your urine clear or pale yellow. Summary  Exercising regularly is important for your overall health, especially when you have diabetes (diabetes mellitus).  Exercising has many health benefits, such as increasing muscle strength and bone density and reducing body fat and stress.  Your health care provider or certified diabetes educator can help you make a plan for the type and frequency of exercise (activity plan) that works for you.  When you start a new exercise or activity, work with your health care provider to make sure the activity is safe for you, and to adjust your insulin, medicines, or food intake as needed. This information is not intended to replace advice given to you by your health care provider. Make sure you discuss any questions you have with your health care provider. Document Released: 03/29/2003 Document Revised: 07/17/2016 Document Reviewed: 06/18/2015 Elsevier Interactive Patient Education  2019 [employer] Diabetes Mellitus and Nutrition, Adult When you have diabetes (diabetes mellitus), it is very important to have healthy eating habits because your blood sugar (glucose) levels are greatly affected by what you eat and drink. Eating healthy foods in the appropriate amounts, at about the same times every day, can help you:  Control your blood glucose.  Lower your risk of heart disease.  Improve your blood pressure.  Reach or maintain a healthy weight. Every person with diabetes is different, and each person has different needs for a meal plan. Your health care provider may  recommend that you work with a diet and nutrition specialist (dietitian) to make a meal plan that is best for you. Your meal plan may vary depending on factors such as:  The calories you need.  The medicines you take.  Your weight.  Your blood glucose, blood pressure, and cholesterol levels.  Your activity level.  Other health conditions you have, such as heart or kidney disease. How do carbohydrates affect me? Carbohydrates, also called carbs, affect your blood glucose level more than any other type of food. Eating carbs naturally raises the amount of glucose in your blood. Carb counting is a method for keeping track of how many carbs you eat. Counting carbs is important to keep your blood glucose at a healthy level, especially if you use insulin or take certain oral diabetes medicines. It is important to know how many carbs you can safely have in each meal. This is different for every person. Your dietitian can help you calculate how many carbs you should have at each meal and for each snack. Foods that contain carbs include:  Bread, cereal, rice, pasta, and crackers.  Potatoes and corn.  Peas, beans, and lentils.  Milk and yogurt.  Fruit and juice.  Desserts, such as cakes, cookies, ice cream, and candy. How does  alcohol affect me? Alcohol can cause a sudden decrease in blood glucose (hypoglycemia), especially if you use insulin or take certain oral diabetes medicines. Hypoglycemia can be a life-threatening condition. Symptoms of hypoglycemia (sleepiness, dizziness, and confusion) are similar to symptoms of having too much alcohol. If your health care provider says that alcohol is safe for you, follow these guidelines:  Limit alcohol intake to no more than 1 drink per day for nonpregnant women and 2 drinks per day for men. One drink equals 12 oz of beer, 5 oz of wine, or 1 oz of hard liquor.  Do not drink on an empty stomach.  Keep yourself hydrated with water, diet soda, or  unsweetened iced tea.  Keep in mind that regular soda, juice, and other mixers may contain a lot of sugar and must be counted as carbs. What are tips for following this plan?  Reading food labels  Start by checking the serving size on the "Nutrition Facts" label of packaged foods and drinks. The amount of calories, carbs, fats, and other nutrients listed on the label is based on one serving of the item. Many items contain more than one serving per package.  Check the total grams (g) of carbs in one serving. You can calculate the number of servings of carbs in one serving by dividing the total carbs by 15. For example, if a food has 30 g of total carbs, it would be equal to 2 servings of carbs.  Check the number of grams (g) of saturated and trans fats in one serving. Choose foods that have low or no amount of these fats.  Check the number of milligrams (mg) of salt (sodium) in one serving. Most people should limit total sodium intake to less than 2,300 mg per day.  Always check the nutrition information of foods labeled as "low-fat" or "nonfat". These foods may be higher in added sugar or refined carbs and should be avoided.  Talk to your dietitian to identify your daily goals for nutrients listed on the label. Shopping  Avoid buying canned, premade, or processed foods. These foods tend to be high in fat, sodium, and added sugar.  Shop around the outside edge of the grocery store. This includes fresh fruits and vegetables, bulk grains, fresh meats, and fresh dairy. Cooking  Use low-heat cooking methods, such as baking, instead of high-heat cooking methods like deep frying.  Cook using healthy oils, such as olive, canola, or sunflower oil.  Avoid cooking with butter, cream, or high-fat meats. Meal planning  Eat meals and snacks regularly, preferably at the same times every day. Avoid going long periods of time without eating.  Eat foods high in fiber, such as fresh fruits,  vegetables, beans, and whole grains. Talk to your dietitian about how many servings of carbs you can eat at each meal.  Eat 4-6 ounces (oz) of lean protein each day, such as lean meat, chicken, fish, eggs, or tofu. One oz of lean protein is equal to: ? 1 oz of meat, chicken, or fish. ? 1 egg. ?  cup of tofu.  Eat some foods each day that contain healthy fats, such as avocado, nuts, seeds, and fish. Lifestyle  Check your blood glucose regularly.  Exercise regularly as told by your health care provider. This may include: ? 150 minutes of moderate-intensity or vigorous-intensity exercise each week. This could be brisk walking, biking, or water aerobics. ? Stretching and doing strength exercises, such as yoga or weightlifting, at least 2 times  a week.  Take medicines as told by your health care provider.  Do not use any products that contain nicotine or tobacco, such as cigarettes and e-cigarettes. If you need help quitting, ask your health care provider.  Work with a Veterinary surgeon or diabetes educator to identify strategies to manage stress and any emotional and social challenges. Questions to ask a health care provider  Do I need to meet with a diabetes educator?  Do I need to meet with a dietitian?  What number can I call if I have questions?  When are the best times to check my blood glucose? Where to find more information:  American Diabetes Association: diabetes.org  Academy of Nutrition and Dietetics: www.eatright.[employer] of Diabetes and Digestive and Kidney Diseases (NIH): CarFlippers.tn Summary  A healthy meal plan will help you control your blood glucose and maintain a healthy lifestyle.  Working with a diet and nutrition specialist (dietitian) can help you make a meal plan that is best for you.  Keep in mind that carbohydrates (carbs) and alcohol have immediate effects on your blood glucose levels. It is important to count carbs and to use alcohol  carefully. This information is not intended to replace advice given to you by your health care provider. Make sure you discuss any questions you have with your health care provider. Document Released: 10/03/2004 Document Revised: 08/06/2016 Document Reviewed: 02/11/2016 Elsevier Interactive Patient Education  2019 [employer]. Hypertension Hypertension, commonly called high blood pressure, is when the force of blood pumping through the arteries is too strong. The arteries are the blood vessels that carry blood from the heart throughout the body. Hypertension forces the heart to work harder to pump blood and may cause arteries to become narrow or stiff. Having untreated or uncontrolled hypertension can cause heart attacks, strokes, kidney disease, and other problems. A blood pressure reading consists of a higher number over a lower number. Ideally, your blood pressure should be below 120/80. The first ("top") number is called the systolic pressure. It is a measure of the pressure in your arteries as your heart beats. The second ("bottom") number is called the diastolic pressure. It is a measure of the pressure in your arteries as the heart relaxes. What are the causes? The cause of this condition is not known. What increases the risk? Some risk factors for high blood pressure are under your control. Others are not. Factors you can change  Smoking.  Having type 2 diabetes mellitus, high cholesterol, or both.  Not getting enough exercise or physical activity.  Being overweight.  Having too much fat, sugar, calories, or salt (sodium) in your diet.  Drinking too much alcohol. Factors that are difficult or impossible to change  Having chronic kidney disease.  Having a family history of high blood pressure.  Age. Risk increases with age.  Race. You may be at higher risk if you are African-American.  Gender. Men are at higher risk than women before age 41. After age 31, women are at  higher risk than men.  Having obstructive sleep apnea.  Stress. What are the signs or symptoms? Extremely high blood pressure (hypertensive crisis) may cause:  Headache.  Anxiety.  Shortness of breath.  Nosebleed.  Nausea and vomiting.  Severe chest pain.  Jerky movements you cannot control (seizures). How is this diagnosed? This condition is diagnosed by measuring your blood pressure while you are seated, with your arm resting on a surface. The cuff of the blood pressure monitor will  be placed directly against the skin of your upper arm at the level of your heart. It should be measured at least twice using the same arm. Certain conditions can cause a difference in blood pressure between your right and left arms. Certain factors can cause blood pressure readings to be lower or higher than normal (elevated) for a short period of time:  When your blood pressure is higher when you are in a health care provider's office than when you are at home, this is called white coat hypertension. Most people with this condition do not need medicines.  When your blood pressure is higher at home than when you are in a health care provider's office, this is called masked hypertension. Most people with this condition may need medicines to control blood pressure. If you have a high blood pressure reading during one visit or you have normal blood pressure with other risk factors:  You may be asked to return on a different day to have your blood pressure checked again.  You may be asked to monitor your blood pressure at home for 1 week or longer. If you are diagnosed with hypertension, you may have other blood or imaging tests to help your health care provider understand your overall risk for other conditions. How is this treated? This condition is treated by making healthy lifestyle changes, such as eating healthy foods, exercising more, and reducing your alcohol intake. Your health care provider may  prescribe medicine if lifestyle changes are not enough to get your blood pressure under control, and if:  Your systolic blood pressure is above 130.  Your diastolic blood pressure is above 80. Your personal target blood pressure may vary depending on your medical conditions, your age, and other factors. Follow these instructions at home: Eating and drinking   Eat a diet that is high in fiber and potassium, and low in sodium, added sugar, and fat. An example eating plan is called the DASH (Dietary Approaches to Stop Hypertension) diet. To eat this way: ? Eat plenty of fresh fruits and vegetables. Try to fill half of your plate at each meal with fruits and vegetables. ? Eat whole grains, such as whole wheat pasta, brown rice, or whole grain bread. Fill about one quarter of your plate with whole grains. ? Eat or drink low-fat dairy products, such as skim milk or low-fat yogurt. ? Avoid fatty cuts of meat, processed or cured meats, and poultry with skin. Fill about one quarter of your plate with lean proteins, such as fish, chicken without skin, beans, eggs, and tofu. ? Avoid premade and processed foods. These tend to be higher in sodium, added sugar, and fat.  Reduce your daily sodium intake. Most people with hypertension should eat less than 1,500 mg of sodium a day.  Limit alcohol intake to no more than 1 drink a day for nonpregnant women and 2 drinks a day for men. One drink equals 12 oz of beer, 5 oz of wine, or 1 oz of hard liquor. Lifestyle   Work with your health care provider to maintain a healthy body weight or to lose weight. Ask what an ideal weight is for you.  Get at least 30 minutes of exercise that causes your heart to beat faster (aerobic exercise) most days of the week. Activities may include walking, swimming, or biking.  Include exercise to strengthen your muscles (resistance exercise), such as pilates or lifting weights, as part of your weekly exercise routine. Try to do  these  types of exercises for 30 minutes at least 3 days a week.  Do not use any products that contain nicotine or tobacco, such as cigarettes and e-cigarettes. If you need help quitting, ask your health care provider.  Monitor your blood pressure at home as told by your health care provider.  Keep all follow-up visits as told by your health care provider. This is important. Medicines  Take over-the-counter and prescription medicines only as told by your health care provider. Follow directions carefully. Blood pressure medicines must be taken as prescribed.  Do not skip doses of blood pressure medicine. Doing this puts you at risk for problems and can make the medicine less effective.  Ask your health care provider about side effects or reactions to medicines that you should watch for. Contact a health care provider if:  You think you are having a reaction to a medicine you are taking.  You have headaches that keep coming back (recurring).  You feel dizzy.  You have swelling in your ankles.  You have trouble with your vision. Get help right away if:  You develop a severe headache or confusion.  You have unusual weakness or numbness.  You feel faint.  You have severe pain in your chest or abdomen.  You vomit repeatedly.  You have trouble breathing. Summary  Hypertension is when the force of blood pumping through your arteries is too strong. If this condition is not controlled, it may put you at risk for serious complications.  Your personal target blood pressure may vary depending on your medical conditions, your age, and other factors. For most people, a normal blood pressure is less than 120/80.  Hypertension is treated with lifestyle changes, medicines, or a combination of both. Lifestyle changes include weight loss, eating a healthy, low-sodium diet, exercising more, and limiting alcohol. This information is not intended to replace advice given to you by your health  care provider. Make sure you discuss any questions you have with your health care provider. Document Released: 01/06/2005 Document Revised: 12/05/2015 Document Reviewed: 12/05/2015 Elsevier Interactive Patient Education  2019 Elsevier Inc.   Pneumococcal Polysaccharide Vaccine: What You Need to Know 1. Why get vaccinated? Vaccination can protect older adults (and some children and younger adults) from pneumococcal disease. Pneumococcal disease is caused by bacteria that can spread from person to person through close contact. It can cause ear infections, and it can also lead to more serious infections of the:  Lungs (pneumonia),  Blood (bacteremia), and  Covering of the brain and spinal cord (meningitis). Meningitis can cause deafness and brain damage, and it can be fatal. Anyone can get pneumococcal disease, but children under 26 years of age, people with certain medical conditions, adults over 23 years of age, and cigarette smokers are at the highest risk. About 18,000 older adults die each year from pneumococcal disease in the [place]. Treatment of pneumococcal infections with penicillin and other drugs used to be more effective. But some strains of the disease have become resistant to these drugs. This makes prevention of the disease, through vaccination, even more important. 2. Pneumococcal polysaccharide vaccine (PPSV23) Pneumococcal polysaccharide vaccine (PPSV23) protects against 23 types of pneumococcal bacteria. It will not prevent all pneumococcal disease. PPSV23 is recommended for:  All adults 25 years of age and older,  Anyone 2 through 53 years of age with certain long-term health problems,  Anyone 2 through 53 years of age with a weakened immune system,  Adults 59 through 53 years of age  who smoke cigarettes or have asthma. Most people need only one dose of PPSV. A second dose is recommended for certain high-risk groups. People 77 and older should get a dose even if  they have gotten one or more doses of the vaccine before they turned 65. Your healthcare provider can give you more information about these recommendations. Most healthy adults develop protection within 2 to 3 weeks of getting the shot. 3. Some people should not get this vaccine  Anyone who has had a life-threatening allergic reaction to PPSV should not get another dose.  Anyone who has a severe allergy to any component of PPSV should not receive it. Tell your provider if you have any severe allergies.  Anyone who is moderately or severely ill when the shot is scheduled may be asked to wait until they recover before getting the vaccine. Someone with a mild illness can usually be vaccinated.  Children less than 75 years of age should not receive this vaccine.  There is no evidence that PPSV is harmful to either a pregnant woman or to her fetus. However, as a precaution, women who need the vaccine should be vaccinated before becoming pregnant, if possible. 4. Risks of a vaccine reaction With any medicine, including vaccines, there is a chance of side effects. These are usually mild and go away on their own, but serious reactions are also possible. About half of people who get PPSV have mild side effects, such as redness or pain where the shot is given, which go away within about two days. Less than 1 out of 100 people develop a fever, muscle aches, or more severe local reactions. Problems that could happen after any vaccine:  People sometimes faint after a medical procedure, including vaccination. Sitting or lying down for about 15 minutes can help prevent fainting, and injuries caused by a fall. Tell your doctor if you feel dizzy, or have vision changes or ringing in the ears.  Some people get severe pain in the shoulder and have difficulty moving the arm where a shot was given. This happens very rarely.  Any medication can cause a severe allergic reaction. Such reactions from a vaccine are  very rare, estimated at about 1 in a million doses, and would happen within a few minutes to a few hours after the vaccination. As with any medicine, there is a very remote chance of a vaccine causing a serious injury or death. The safety of vaccines is always being monitored. For more information, visit: http://floyd.org/ 5. What if there is a serious reaction? What should I look for? Look for anything that concerns you, such as signs of a severe allergic reaction, very high fever, or unusual behavior. Signs of a severe allergic reaction can include hives, swelling of the face and throat, difficulty breathing, a fast heartbeat, dizziness, and weakness. These would usually start a few minutes to a few hours after the vaccination. What should I do? If you think it is a severe allergic reaction or other emergency that can't wait, call [id number] or get to the nearest hospital. Otherwise, call your doctor. Afterward, the reaction should be reported to the Vaccine Adverse Event Reporting System (VAERS). Your doctor might file this report, or you can do it yourself through the VAERS web site at www.vaers.LAgents.no, or by calling 1-(256)008-4084. VAERS does not give medical advice. 6. How can I learn more?  Ask your doctor. He or she can give you the vaccine package insert or suggest other sources of information.  Call your local or state health department.  Contact the Centers for Disease Control and Prevention (CDC): ? Call 323-281-57071-(201) 707-9097 (1-800-CDC-INFO) or ? Visit CDC's website at PicCapture.uywww.cdc.gov/vaccines CDC Vaccine Information Statement PPSV Vaccine (05/13/2013) This information is not intended to replace advice given to you by your health care provider. Make sure you discuss any questions you have with your health care provider. Document Released: 11/03/2005 Document Revised: 08/18/2017 Document Reviewed: 08/18/2017 Elsevier Interactive Patient Education  2019 [employer]

## 2018-03-24 NOTE — Progress Notes (Signed)
Patient was educated on the use of the True Metrix blood glucose meter. Reviewed necessary supplies and operation of the meter. Also reviewed goal blood glucose levels. Patient was able to demonstrate use. All questions and concerns were addressed.  Pt provided with written materials concerning MyPlate and diet counseling. Offered verbal counseling. Pt is amenable to improve his diet.   Pt provided with metformin counseling. All questions and concerns addressed.

## 2018-03-25 ENCOUNTER — Encounter: Payer: Self-pay | Admitting: Pharmacist

## 2018-03-25 LAB — MICROALBUMIN / CREATININE URINE RATIO
Creatinine, Urine: 91 mg/dL
Microalb/Creat Ratio: <3 mg/g creat (ref 0–29)
Microalbumin, Urine: <3 ug/mL

## 2018-04-26 ENCOUNTER — Telehealth: Payer: Self-pay

## 2018-04-26 NOTE — Telephone Encounter (Signed)
He needs to call [facility] and they can transfer the RX, he's got nearly a years worth of refills with [facility].

## 2018-04-26 NOTE — Telephone Encounter (Signed)
Patient needs refill on Metformin and Lisinopril sent to [facility] on [address] due to our pharmacy only mailing medications.

## 2018-05-17 ENCOUNTER — Other Ambulatory Visit: Payer: Self-pay

## 2018-05-17 ENCOUNTER — Ambulatory Visit: Payer: Self-pay | Attending: Nurse Practitioner | Admitting: Nurse Practitioner

## 2018-05-24 MED FILL — TRUE METRIX TEST STRIP: 25 days supply | Qty: 100 | Fill #1

## 2018-05-24 MED FILL — LISINOPRIL 10 MG TABS: 10 | 30 days supply | Qty: 30 | Fill #1

## 2018-05-24 MED FILL — metFORMIN HCL 1000 MG TABS: 1000 | 30 days supply | Qty: 60 | Fill #1

## 2020-12-21 IMAGING — CR DG CHEST 2V
2 series · 2 of 2 positions shown · non-contrast
Comparison: None.

CLINICAL DATA: Chest pain.  Hypertension.

EXAM:
CHEST - 2 VIEW

[chest pa]
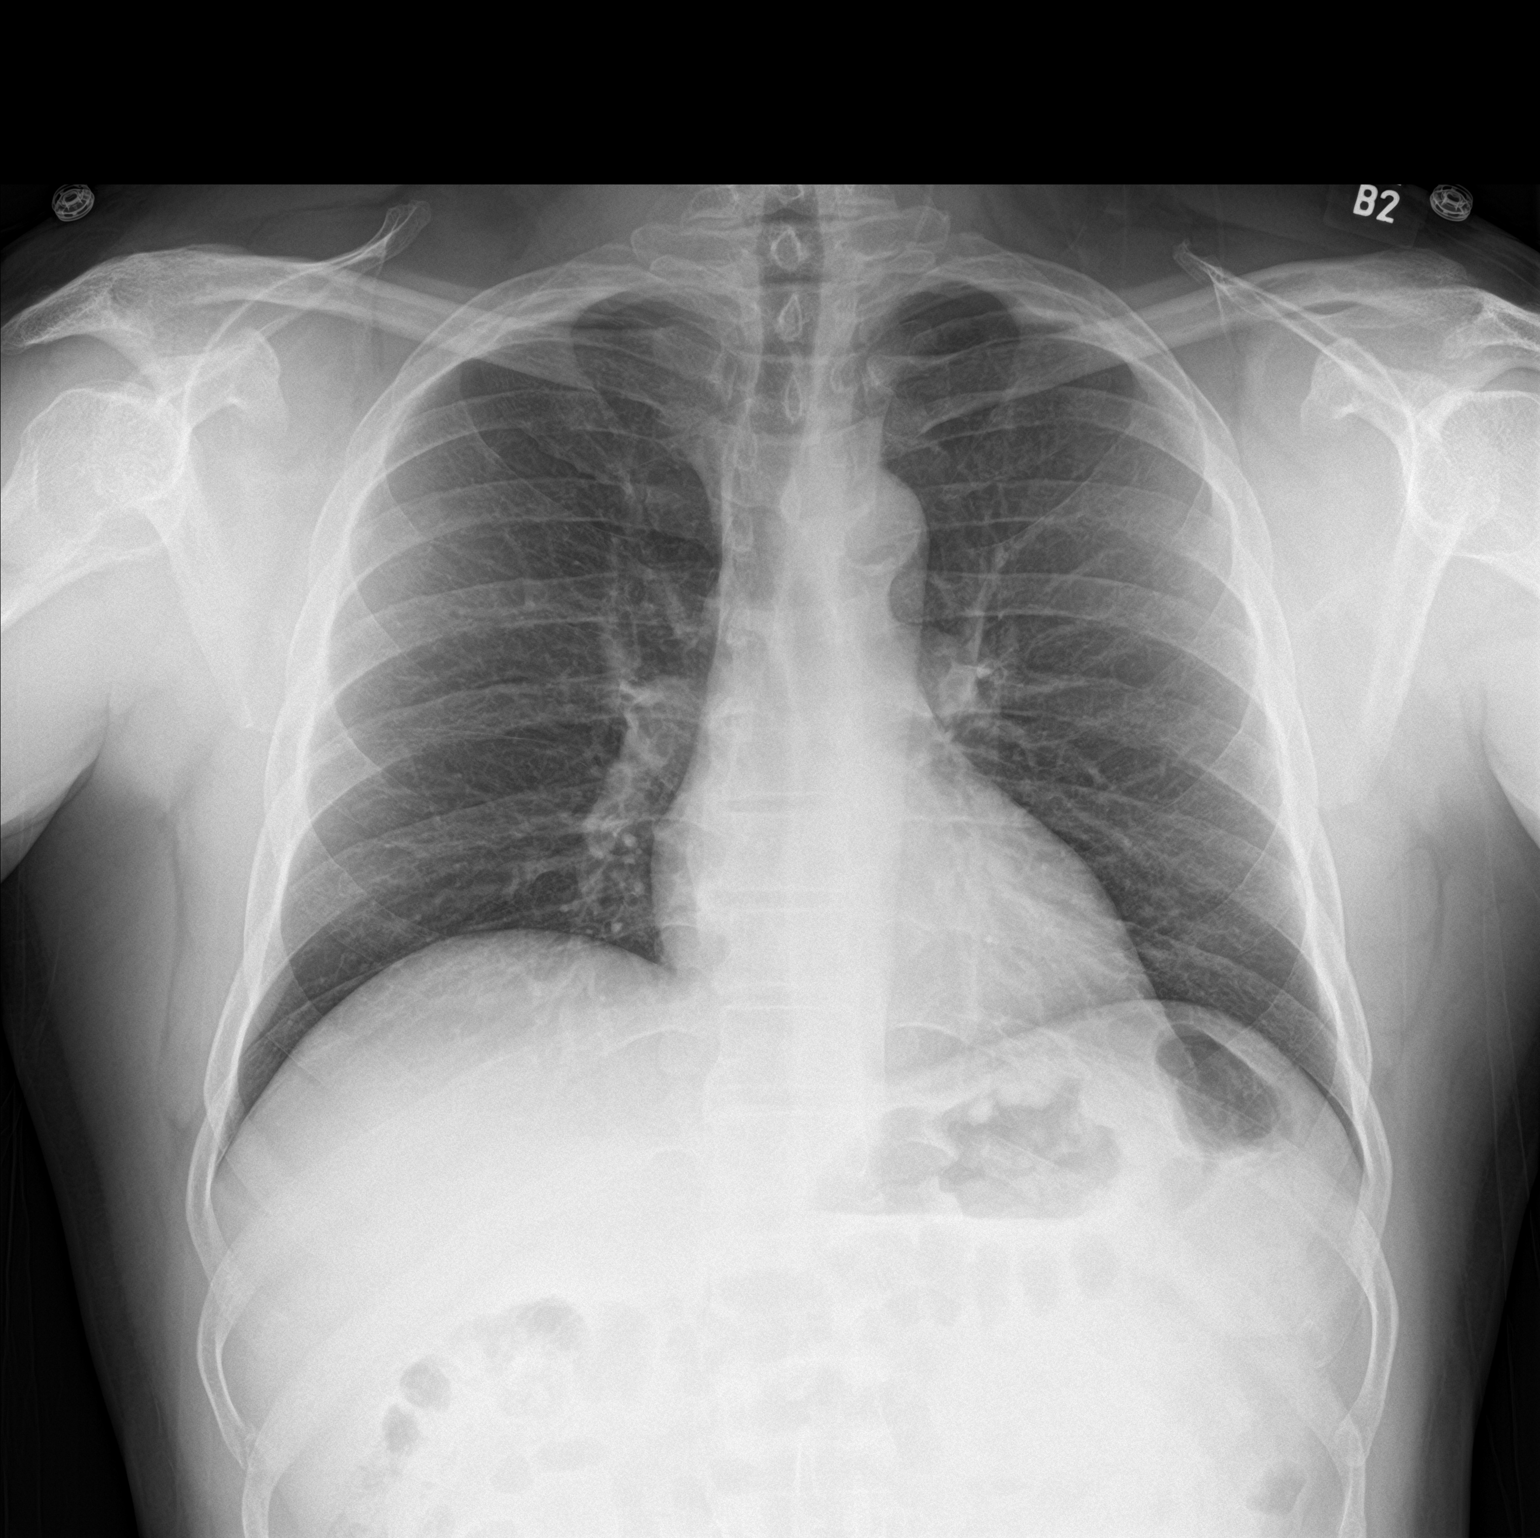

[chest lat]
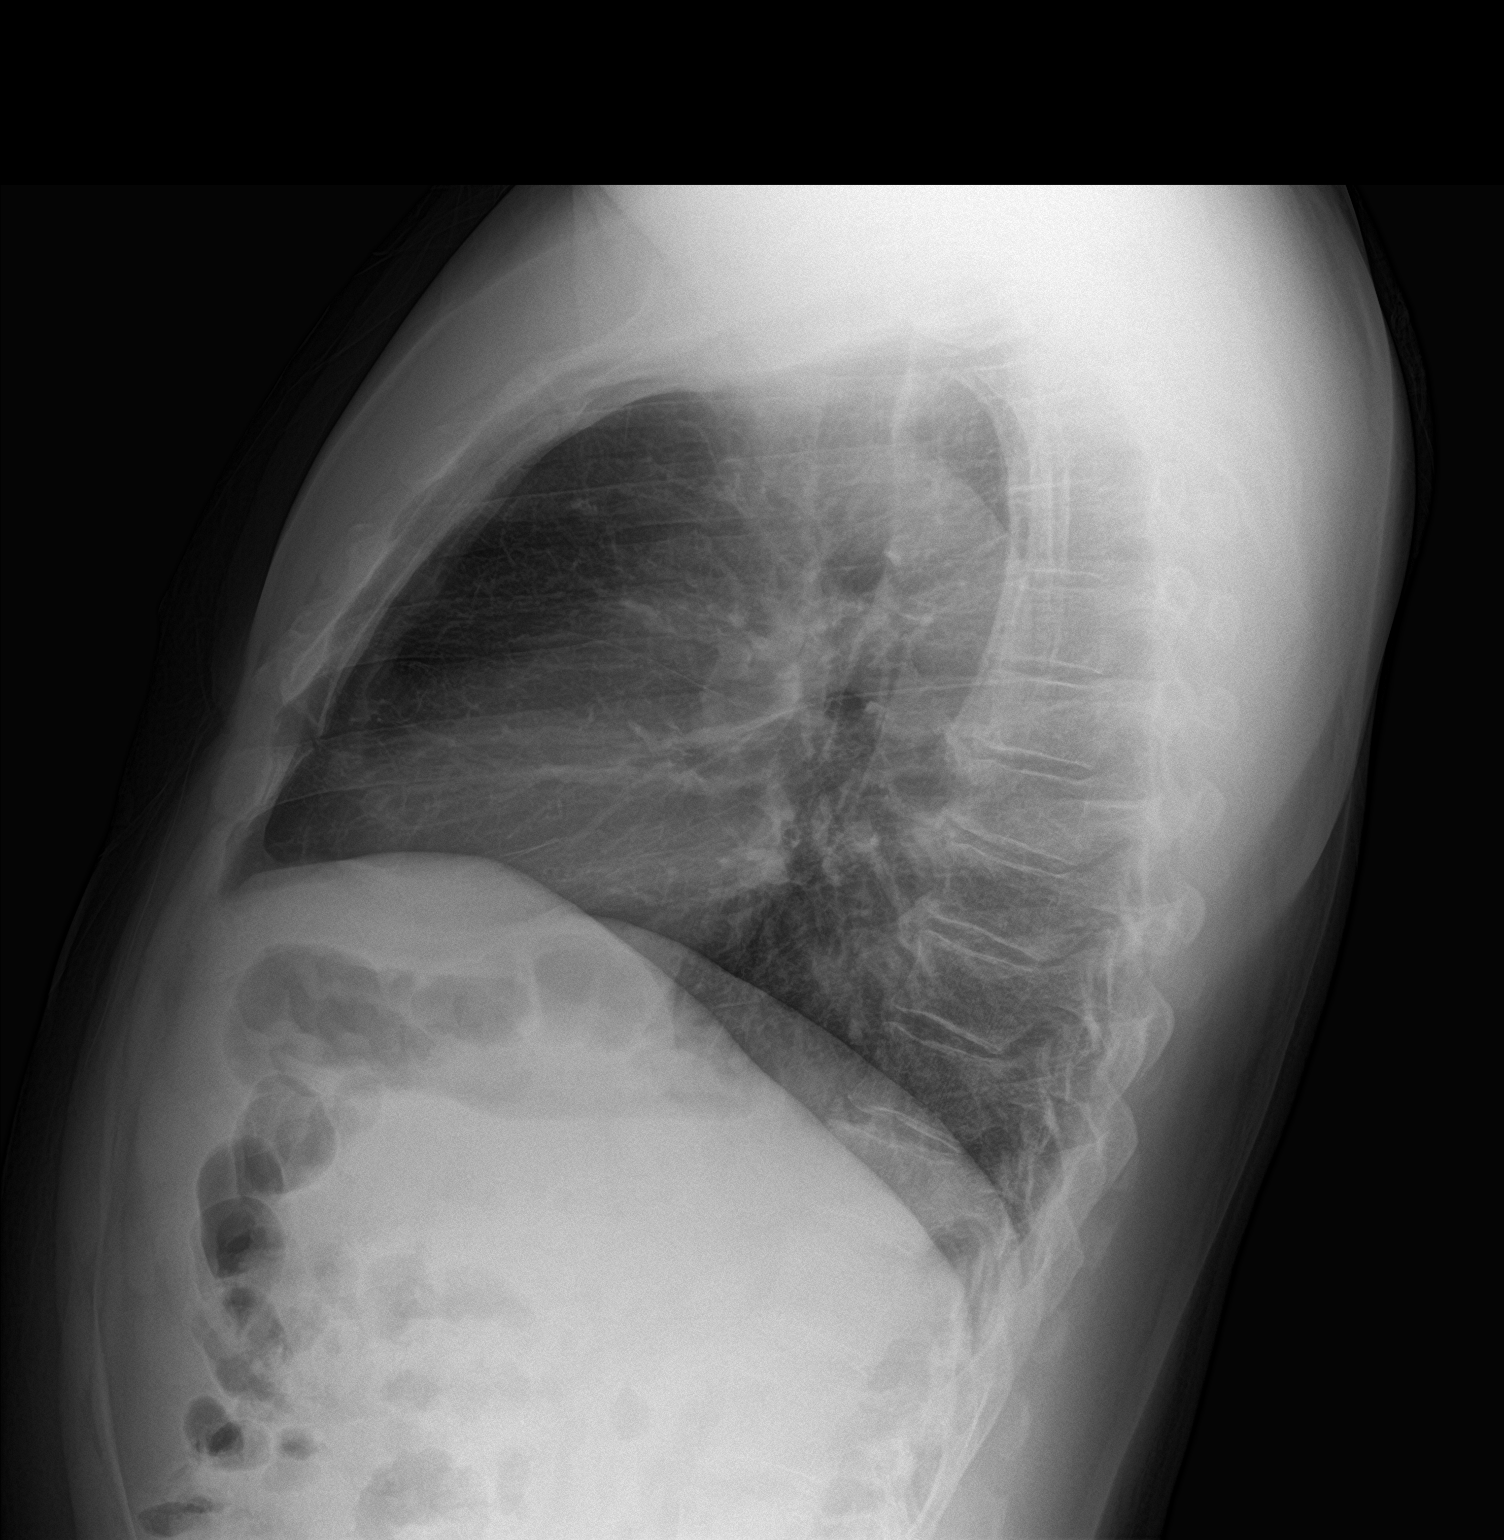

[2 of 2 positions shown; findings below may reference images not displayed]

FINDINGS: The heart size and mediastinal contours are within normal limits.
Both lungs are clear. The visualized skeletal structures are
unremarkable.
IMPRESSION: No active cardiopulmonary disease.

## 2021-07-04 ENCOUNTER — Emergency Department (HOSPITAL_COMMUNITY)
Admission: EM | Admit: 2021-07-04 | Discharge: 2021-07-04 | Disposition: A | Discharge status: Home / Self Care | Payer: Self-pay | Attending: Emergency Medicine | Admitting: Emergency Medicine

## 2021-07-04 ENCOUNTER — Other Ambulatory Visit: Payer: Self-pay

## 2021-07-04 ENCOUNTER — Encounter (HOSPITAL_COMMUNITY): Payer: Self-pay

## 2021-07-04 ENCOUNTER — Emergency Department (HOSPITAL_COMMUNITY): Payer: Self-pay

## 2021-07-04 DIAGNOSIS — Z7984 Long term (current) use of oral hypoglycemic drugs: Secondary | ICD-10-CM | POA: Insufficient documentation

## 2021-07-04 DIAGNOSIS — X58XXXA Exposure to other specified factors, initial encounter: Secondary | ICD-10-CM | POA: Insufficient documentation

## 2021-07-04 DIAGNOSIS — Y9302 Activity, running: Secondary | ICD-10-CM | POA: Insufficient documentation

## 2021-07-04 DIAGNOSIS — R519 Headache, unspecified: Secondary | ICD-10-CM | POA: Insufficient documentation

## 2021-07-04 DIAGNOSIS — I10 Essential (primary) hypertension: Secondary | ICD-10-CM | POA: Insufficient documentation

## 2021-07-04 DIAGNOSIS — S86001A Unspecified injury of right Achilles tendon, initial encounter: Secondary | ICD-10-CM | POA: Insufficient documentation

## 2021-07-04 DIAGNOSIS — M25511 Pain in right shoulder: Secondary | ICD-10-CM | POA: Insufficient documentation

## 2021-07-04 DIAGNOSIS — M25571 Pain in right ankle and joints of right foot: Secondary | ICD-10-CM | POA: Insufficient documentation

## 2021-07-04 DIAGNOSIS — Z79899 Other long term (current) drug therapy: Secondary | ICD-10-CM | POA: Insufficient documentation

## 2021-07-04 DIAGNOSIS — E119 Type 2 diabetes mellitus without complications: Secondary | ICD-10-CM | POA: Insufficient documentation

## 2021-07-04 MED ORDER — AMLODIPINE BESYLATE 5 MG PO TABS: 5.0000 mg | ORAL_TABLET | Freq: Every day | ORAL | 0 refills | Status: DC

## 2021-07-04 MED ORDER — OXYCODONE-ACETAMINOPHEN 5-325 MG PO TABS
1.0000 | ORAL_TABLET | Freq: Once | ORAL | Status: AC
  Administered 2021-07-04: 1 via ORAL
  Filled 2021-07-04: qty 1

## 2021-07-04 MED ORDER — AMLODIPINE BESYLATE 5 MG PO TABS: 5.0000 mg | ORAL_TABLET | Freq: Every day | ORAL | 0 refills | Status: AC

## 2021-07-04 MED ORDER — AMLODIPINE BESYLATE 5 MG PO TABS
5.0000 mg | ORAL_TABLET | Freq: Once | ORAL | Status: AC
Start: 2021-07-04 — End: 2021-07-04
  Administered 2021-07-04: 5 mg via ORAL
  Filled 2021-07-04: qty 1

## 2021-07-04 MED ORDER — OXYCODONE-ACETAMINOPHEN 5-325 MG PO TABS: 1.0000 | ORAL_TABLET | Freq: Four times a day (QID) | ORAL | 0 refills | Status: AC | PRN

## 2021-07-04 NOTE — ED Triage Notes (Signed)
Patient got attacked by hornets and was running right foot got caught and feels as though he has torn his achilles.  Reports unable to support right foot and has similar happen to left previously

## 2021-07-04 NOTE — Discharge Instructions (Addendum)
You came to the emergency department today to be evaluated for your right ankle and right shoulder pain.  The x-ray of your right ankle showed a possible fracture to your right medial malleus.  There is also concern for a Achilles tendon tear with your posterior ankle pain.  Due to this you were placed in a Cam walking boot and given crutches.  Please do not bear weight on your affected leg until you can be reevaluated by an orthopedic provider.  Please call [facility] to schedule a follow-up appointment.  While in the emergency department your blood pressure was found to be elevated.  You were started on the medication Norvasc.  Please take this medication as prescribed and review attached information in your discharge paperwork.  Please follow-up with the [facility] for further management of your blood pressure.  Today you received medications that may make you sleepy or impair your ability to make decisions.  For the next 24 hours please do not drive, operate heavy machinery, care for a small child with out another adult present, or perform any activities that may cause harm to you or someone else if you were to fall asleep or be impaired.   You are being prescribed a medication which may make you sleepy or mpair your ability to make decisions. Please follow up of listed precautions for at least 24 hours after taking one dose.  Get help right away if: Your foot, leg, toes, or ankle: Tingles or becomes numb. Becomes swollen. Turns pale or blue.

## 2021-07-04 NOTE — ED Provider Triage Note (Signed)
Emergency Medicine Provider Triage Evaluation Note  [name] , a 56 y.o. male  was evaluated in triage.  Pt complains of right ankle pain.  Patient states that he was running a mortise this morning when his foot got caught and he had a sudden onset of posterior ankle pain.  Patient feels that he is unable to push off with his right foot since his injury.  Patient reports that he has tore his Achilles on the left leg and states that this feels similar.  Patient reports that he has been stung by bees and hornets prior with no known allergic reaction.  Review of Systems  Positive: Right ankle pain Negative: Numbness, weakness, angioedema, trouble swallowing, trouble breathing  Physical Exam  BP (!) 162/92 (BP Location: Right Arm)   Pulse 99   Temp 98.7 F (37.1 C) (Oral)   Resp (!) 21   Ht 5\' 7"  (1.702 m)   Wt 81.6 kg   SpO2 99%   BMI 28.19 kg/m  Gen:   Awake, no distress   Resp:  Normal effort  MSK:   Moves extremities without difficulty; tenderness to posterior right ankle.  Pulse, motor, and station intact distally.  Patient has full range of motion to right ankle with reports of pain. Other:  No angioedema.  Speaks in full complete sentences without difficulty.  Medical Decision Making  Medically screening exam initiated at 10:23 AM.  Appropriate orders placed.  [name] was informed that the remainder of the evaluation will be completed by another provider, this initial triage assessment does not replace that evaluation, and the importance of remaining in the ED until their evaluation is complete.  X-ray imaging ordered to evaluate for possible fracture/dislocation.  Patient was advised to inform staff while in waiting room if he develops any signs or symptoms of an allergic reaction.   Nancy Nordmann, Haskel Schroeder 07/04/21 1024

## 2021-07-04 NOTE — Progress Notes (Signed)
Orthopedic Tech Progress Note Patient Details:  [name] Feb 01, 1965 [id number]  Ortho Devices Type of Ortho Device: Crutches, CAM walker Ortho Device/Splint Location: rle Ortho Device/Splint Interventions: Ordered, Application, Adjustment   Post Interventions Patient Tolerated: Well Instructions Provided: Adjustment of device, Care of device, Poper ambulation with device  [name] [name] 07/04/2021, 7:17 PM

## 2021-07-04 NOTE — ED Provider Notes (Signed)
[facility] EMERGENCY DEPARTMENT Provider Note   CSN: [id number] Arrival date & time: 07/04/21  1008     History  Chief Complaint  Patient presents with   Leg Injury    [name] is a 56 y.o. male with past medical history of hypertension, diabetes mellitus, left Achilles tendon repair, bilateral knee arthroscopy.  Presents to the emergency department with a chief complaint of right ankle pain.  Patient states that this morning he was running away from hornets stinging him when his right foot got stuck and had extreme plantarflexion.  Patient reports that he had sudden onset of pain to his right posterior ankle after this episode.  Pain has been constant since then.  Patient reports that he had difficulty pushing off to walk after this incident.  Patient rates pain 10/10 on the pain scale.  States that pain has been getting progressively worse while waiting in the waiting room.  Patient also complains of pain to his right shoulder.  States that pain was not present at first however has gradually began since his fall.  Patient is right-hand dominant.  Patient is also now endorsing a headache.  States that headache onset was gradual pain progressively worse over time.  Patient states that he has not had anything to eat today and attributes his headache to lack of food or drink.  Patient denies any numbness, weakness, color change, wound, pallor, neck pain, back pain  HPI     Home Medications Prior to Admission medications   Medication Sig Start Date End Date Taking? Authorizing Provider  Blood Glucose Monitoring Suppl (TRUE METRIX METER) w/Device KIT Check blood sugars fasting and bedtime 03/24/18   Freeman Caldron M, PA-C  glucose blood (TRUE METRIX BLOOD GLUCOSE TEST) test strip Use as instructed 03/24/18   Argentina Donovan, PA-C  lisinopril (PRINIVIL,ZESTRIL) 10 MG tablet Take 1 tablet (10 mg total) by mouth daily. 03/24/18   Argentina Donovan, PA-C  metFORMIN  (GLUCOPHAGE) 1000 MG tablet Take 1 tablet (1,000 mg total) by mouth 2 (two) times daily with a meal. 03/24/18   McClung, Dionne Bucy, PA-C  TRUEPLUS LANCETS 28G MISC Check blood sugars fasting and bedtime 03/24/18   Argentina Donovan, PA-C      Allergies    Patient has no known allergies.    Review of Systems   Review of Systems  Constitutional:  Negative for chills and fever.  Eyes:  Negative for visual disturbance.  Respiratory:  Negative for shortness of breath.   Cardiovascular:  Negative for chest pain.  Musculoskeletal:  Positive for arthralgias and myalgias. Negative for back pain and neck pain.  Skin:  Negative for color change, pallor, rash and wound.  Neurological:  Positive for headaches. Negative for weakness and numbness.    Physical Exam Updated Vital Signs BP (!) 192/75 (BP Location: Right Arm)   Pulse (!) 53   Temp 98.2 F (36.8 C) (Oral)   Resp 15   Ht $R'5\' 7"'pS$  (1.702 m)   Wt 81.6 kg   SpO2 98%   BMI 28.19 kg/m  Physical Exam Vitals and nursing note reviewed.  Constitutional:      General: He is not in acute distress.    Appearance: He is not ill-appearing, toxic-appearing or diaphoretic.  HENT:     Head: Normocephalic.  Eyes:     General: No scleral icterus.       Right eye: No discharge.        Left eye: No  discharge.  Cardiovascular:     Rate and Rhythm: Normal rate.  Pulmonary:     Effort: Pulmonary effort is normal.  Musculoskeletal:     Right shoulder: Tenderness present. No swelling, deformity, effusion or laceration. Normal range of motion.     Left shoulder: No swelling, deformity, effusion, laceration, tenderness or bony tenderness. Normal range of motion.     Right upper arm: Normal.     Left upper arm: Normal.     Right elbow: Normal.     Left elbow: Normal.     Right forearm: Normal.     Left forearm: Normal.     Right wrist: Normal.     Left wrist: Normal.     Right hand: No swelling, deformity, lacerations, tenderness or bony tenderness.  Normal range of motion. Normal strength. Normal sensation. Normal capillary refill.     Right lower leg: Normal.     Right ankle: No swelling, deformity, ecchymosis or lacerations. Tenderness present. No lateral malleolus or medial malleolus tenderness. Normal range of motion.     Right Achilles Tendon: Tenderness and defect present. Thompson's test negative.     Right foot: Normal range of motion and normal capillary refill. No swelling, deformity, tenderness, bony tenderness or crepitus. Normal pulse.     Comments: No midline tenderness or deformity to cervical, thoracic, or lumbar spine.  No tenderness to right medial or lateral malleus.  Tenderness to posterior right ankle.  Full range of motion to affected ankle.  Pulse, motor, and sensation intact distally.  Small defect noted to right Achilles tendon  Patient has tenderness to anterior right shoulder.  Full range of motion present.  Patient does have pain with range of motion.  No swelling, deformity, or wound.  Skin:    General: Skin is warm and dry.  Neurological:     General: No focal deficit present.     Mental Status: He is alert.  Psychiatric:        Behavior: Behavior is cooperative.     ED Results / Procedures / Treatments   Labs (all labs ordered are listed, but only abnormal results are displayed) Labs Reviewed - No data to display  EKG None  Radiology DG Ankle Complete Right  Result Date: 07/04/2021 CLINICAL DATA:  Posterior ankle pain after an injury while running when attacked by hornets. EXAM: RIGHT ANKLE - COMPLETE 3+ VIEW COMPARISON:  None Available. FINDINGS: There is a bony fragments at the tip of the medial malleolus with smooth margins. Remainder of the osseous structures have a normal appearance. Ankle mortise is well-maintained. Mild-to-moderate soft tissue swelling at the ankle. IMPRESSION: There is a bony fragment at the tip of the medial malleolus with smooth margin. Acute fracture cannot be entirely  excluded. Clinical correlation and CT/MRI of the ankle to be considered if clinically warranted. Mild-to-moderate soft tissue swelling. Electronically Signed   By: Frazier Richards M.D.   On: 07/04/2021 10:48    Procedures Procedures    Medications Ordered in ED Medications - No data to display  ED Course/ Medical Decision Making/ A&P                           Medical Decision Making Amount and/or Complexity of Data Reviewed Radiology: ordered.  Risk Prescription drug management.   Alert 56 year old male in no acute distress, nontoxic-appearing.  Presents to the ED with a chief complaint of right ankle and right shoulder pain.  Information obtained  from patient and patient's wife at bedside.  Past medical records were reviewed including previous provider notes, labs, and imaging.  Due to reports of ankle pain after suffering a fall concern for acute osseous abnormality.  X-ray imaging was obtained.  I personally viewed and interpreted patient's x-ray imaging.  Agree with radiology interpretation of bony fragment at the tip of the medial malleus with smooth margin.    On physical exam patient has no tenderness to bilateral right malleus.  Does have tenderness to posterior ankle.  Small defect noted over Achilles tendon however patient has full active range of motion to right ankle.  Pulse, motor, and sensation intact distally.  With no tenderness over malleus lower suspicion for acute fracture at this time.  Will place patient in cam walking boot and make nonweightbearing with crutches.  Patient will need to follow-up with orthopedic provider in outpatient setting.  Arthroplasty and Achilles tendon repair was done by Dr. Berenice Primas with [facility].  Patient reports right shoulder pain.  Patient has full range of motion to affected shoulder.  Pulse, motor, and sensation intact distally.  Patient was offered x-ray imaging however declines offer at this time.  Patient is noted to be  hypertensive with blood pressure of 192/75.  Patient reports that he has not been on any medication for blood pressure and years.  Suspect that his elevated blood pressure is due to underlying hypertension with pain from his injury.  Patient was previously on lisinopril however has not taken this medication in multiple years.  Will start patient on Norvasc.  Patient given information to follow-up with [facility] in outpatient setting.  We will prescribe patient with a short course of Percocet pain medication for his injury.  Also discuss symptomatic treatment.  Patient care discussed with attending physician Dr.Long.    Based on patient's chief complaint, I considered admission might be necessary, however after reassuring ED workup feel patient is reasonable for discharge.  Discussed results, findings, treatment and follow up. Patient advised of return precautions. Patient verbalized understanding and agreed with plan.  Portions of this note were generated with [employer]. Dictation errors may occur despite best attempts at proofreading.         Final Clinical Impression(s) / ED Diagnoses Final diagnoses:  Hypertension, unspecified type  Acute right ankle pain  Acute pain of right shoulder    Rx / DC Orders ED Discharge Orders          Ordered    amLODipine (NORVASC) 5 MG tablet  Daily,   Status:  Discontinued        07/04/21 1850    amLODipine (NORVASC) 5 MG tablet  Daily        07/04/21 1852    oxyCODONE-acetaminophen (PERCOCET/ROXICET) 5-325 MG tablet  Every 6 hours PRN        07/04/21 1853              Dyann Ruddle 07/04/21 1857    Margette Fast, MD 07/04/21 2333

## 2024-04-14 IMAGING — CR DG ANKLE COMPLETE 3+V*R*
3 series · 3 of 3 positions shown · non-contrast
Comparison: None Available.

CLINICAL DATA: Posterior ankle pain after an injury while running
when attacked by hornets.

EXAM:
RIGHT ANKLE - COMPLETE 3+ VIEW

[ankle ap]
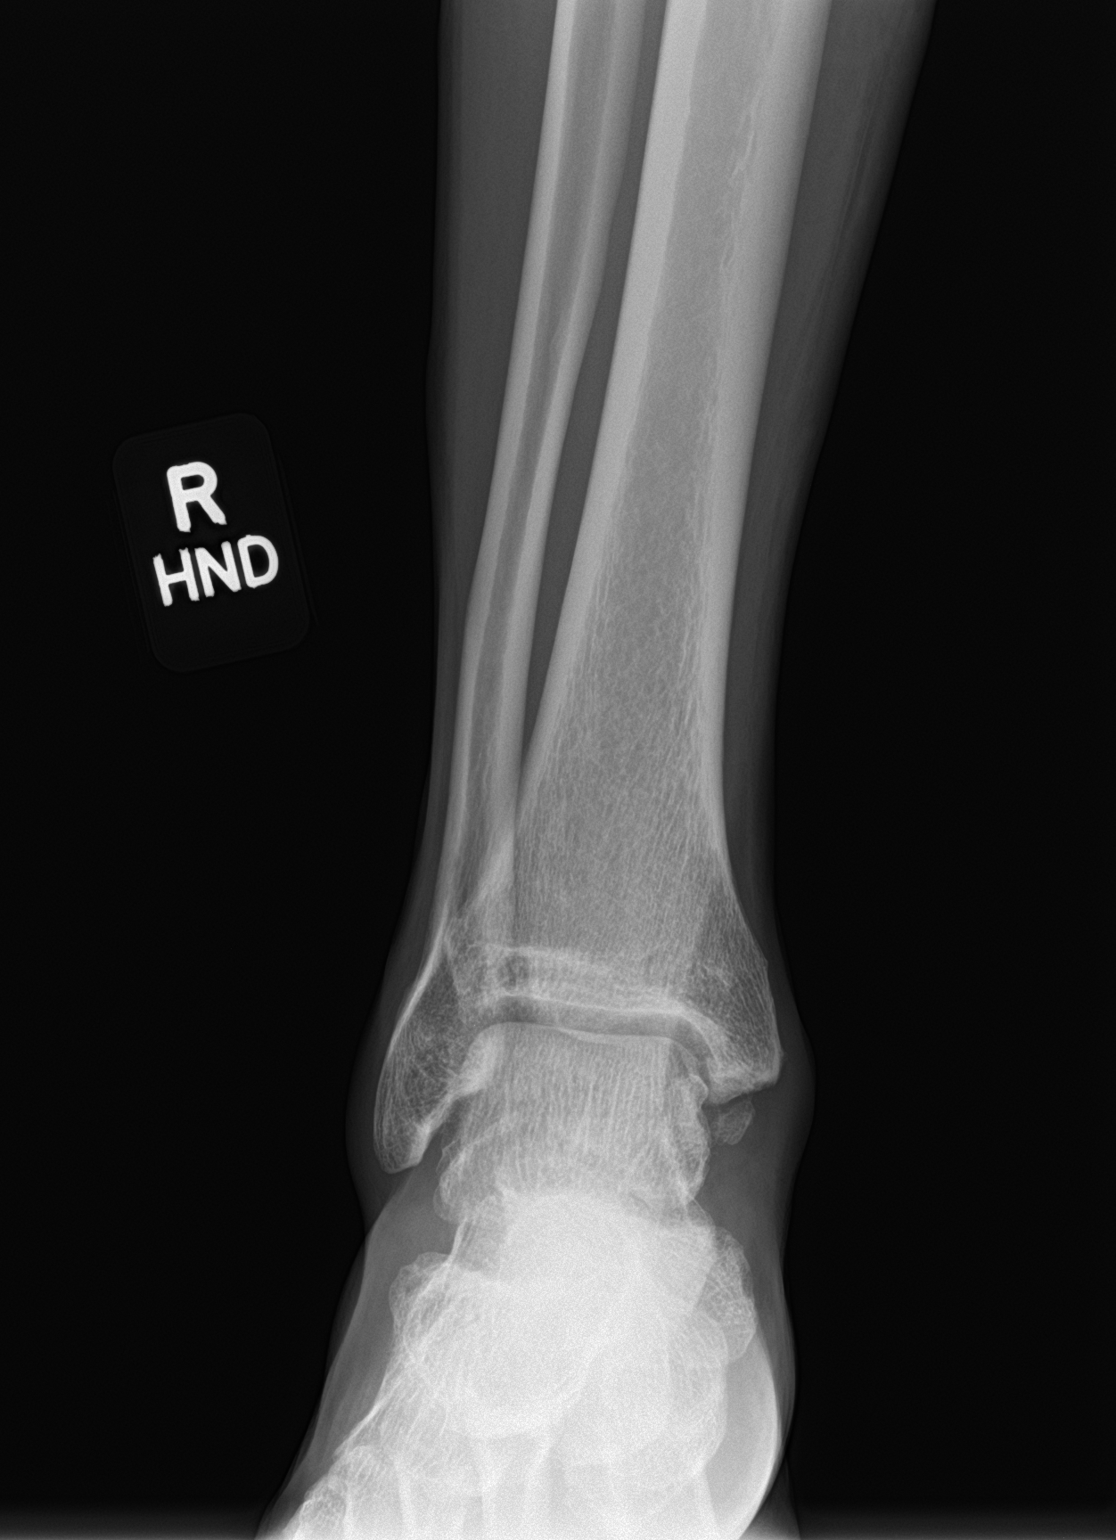

[ankle obl]
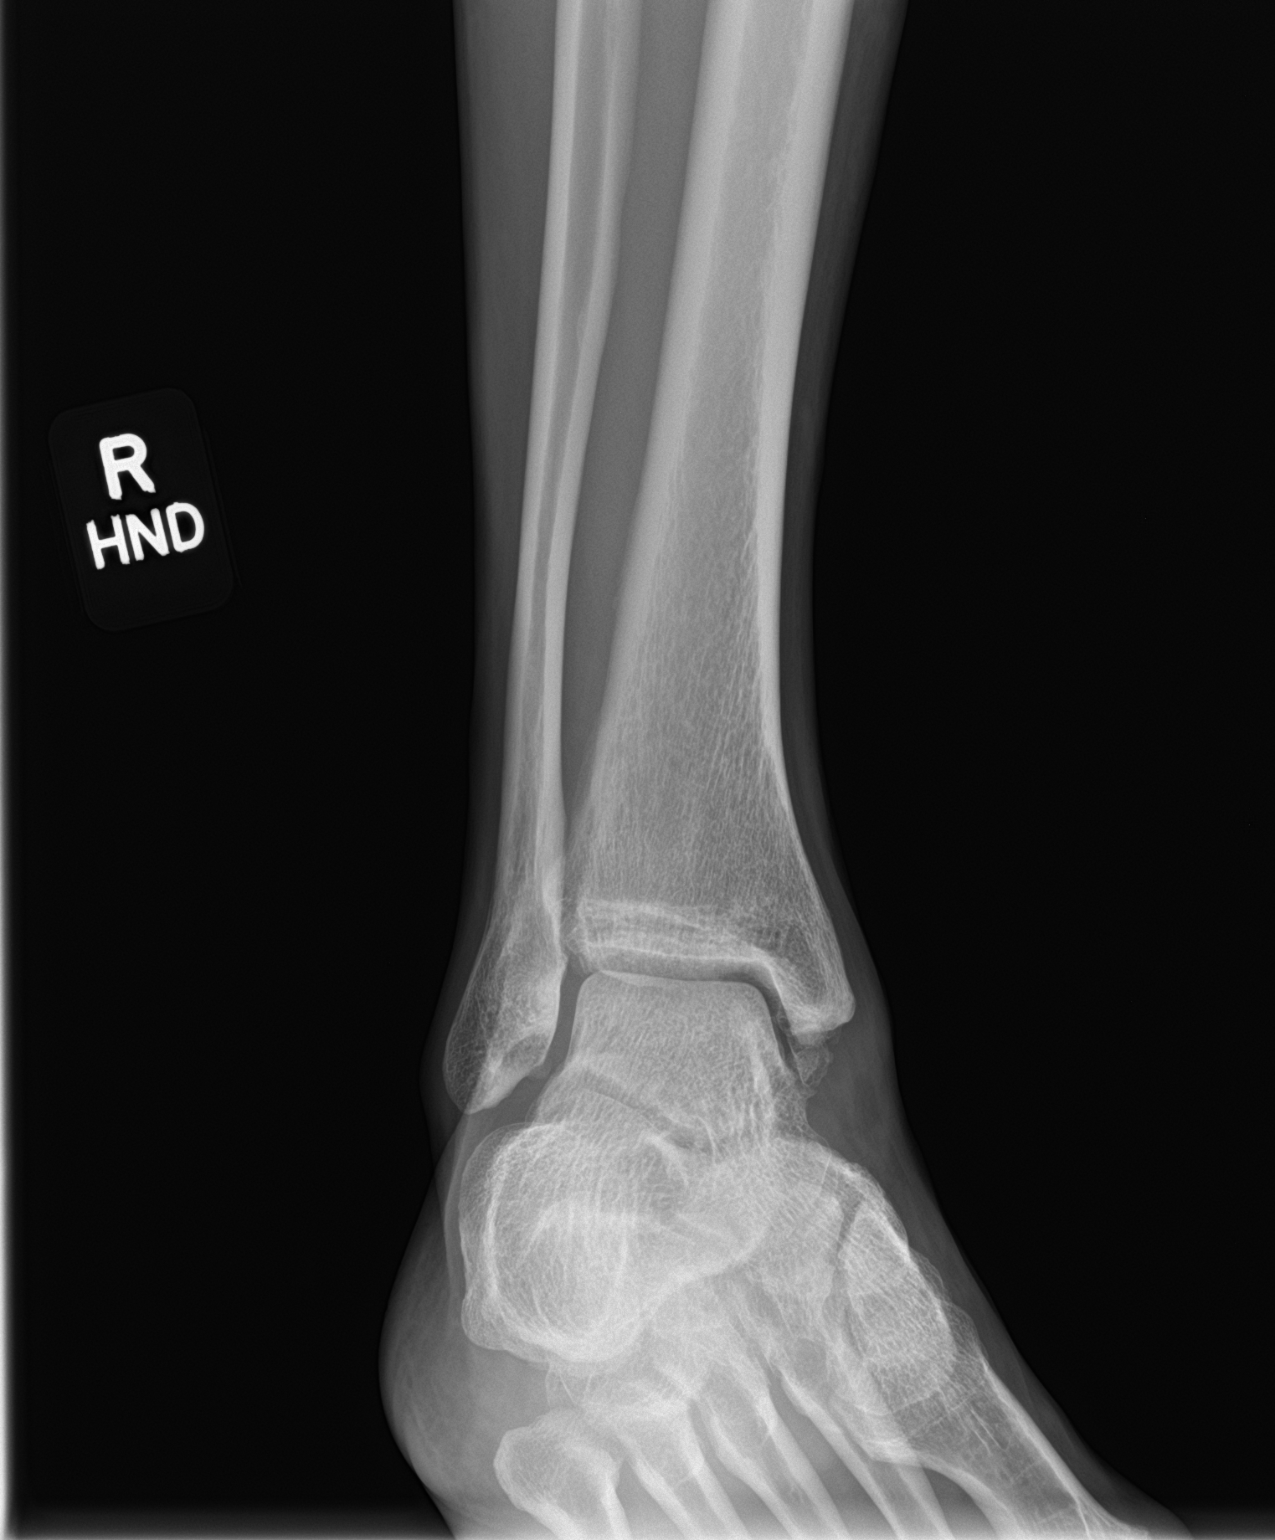

[ankle lat]
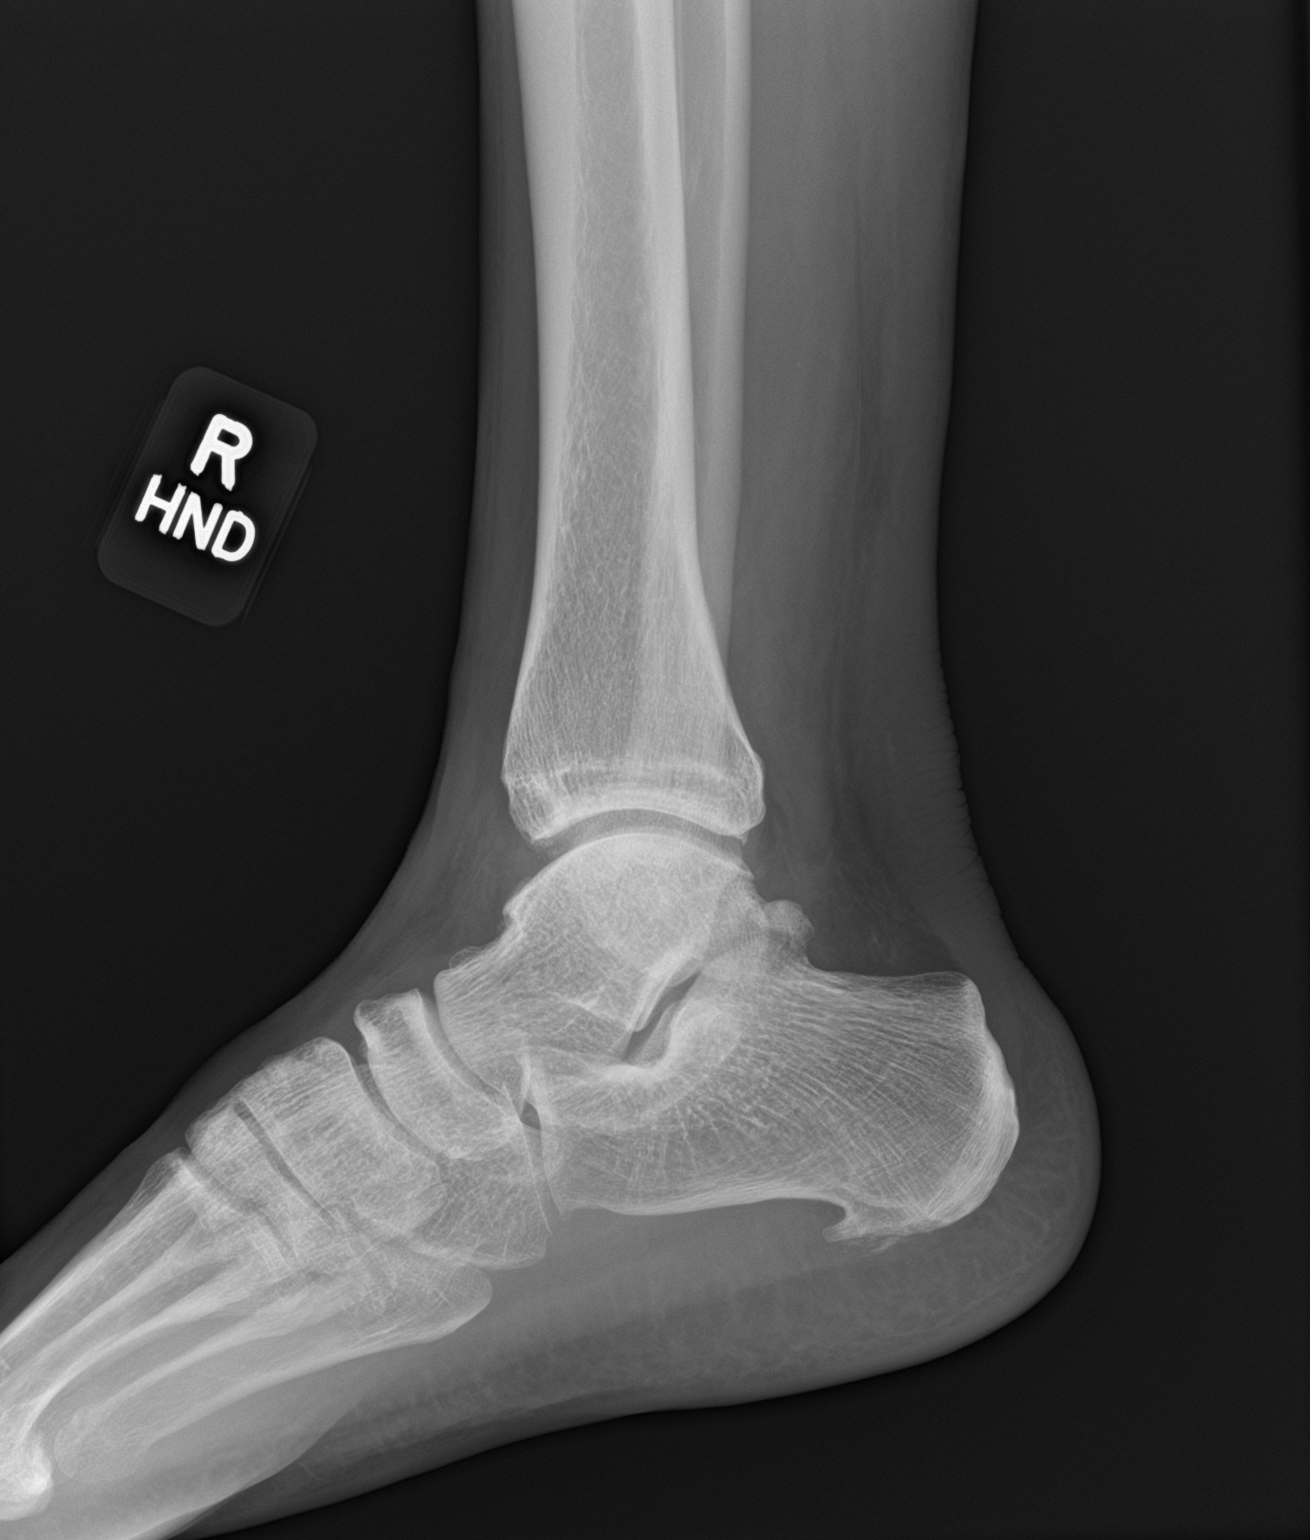

[3 of 3 positions shown; findings below may reference images not displayed]

FINDINGS: There is a bony fragments at the tip of the medial malleolus with
smooth margins. Remainder of the osseous structures have a normal
appearance. Ankle mortise is well-maintained. Mild-to-moderate soft
tissue swelling at the ankle.
IMPRESSION: There is a bony fragment at the tip of the medial malleolus with
smooth margin. Acute fracture cannot be entirely excluded. Clinical
correlation and CT/MRI of the ankle to be considered if clinically
warranted. Mild-to-moderate soft tissue swelling.
